# Patient Record
Sex: Female | Born: 1945 | Race: White | Hispanic: No | Smoking: Never smoker
Health system: Southern US, Community
[De-identification: ages and names within clinical notes are randomized; demographics above are authoritative.]

## PROBLEM LIST (undated history)

## (undated) DIAGNOSIS — L309 Dermatitis, unspecified: Secondary | ICD-10-CM

## (undated) DIAGNOSIS — K589 Irritable bowel syndrome without diarrhea: Secondary | ICD-10-CM

## (undated) DIAGNOSIS — F32A Depression, unspecified: Secondary | ICD-10-CM

## (undated) DIAGNOSIS — M1990 Unspecified osteoarthritis, unspecified site: Secondary | ICD-10-CM

## (undated) DIAGNOSIS — I10 Essential (primary) hypertension: Secondary | ICD-10-CM

## (undated) HISTORY — DX: Essential (primary) hypertension: I10

## (undated) HISTORY — PX: DILATION AND CURETTAGE OF UTERUS: SHX78

## (undated) HISTORY — DX: Dermatitis, unspecified: L30.9

---

## 2005-07-02 ENCOUNTER — Ambulatory Visit (HOSPITAL_COMMUNITY): Admission: RE | Admit: 2005-07-02 | Discharge: 2005-07-02 | Payer: Self-pay | Admitting: Emergency Medicine

## 2005-10-01 ENCOUNTER — Other Ambulatory Visit: Admission: RE | Admit: 2005-10-01 | Discharge: 2005-10-01 | Payer: Self-pay | Admitting: Emergency Medicine

## 2011-10-12 ENCOUNTER — Encounter (HOSPITAL_COMMUNITY): Payer: Self-pay | Admitting: Pharmacy Technician

## 2011-10-14 NOTE — Patient Instructions (Addendum)
Your procedure is scheduled on: 10/25/2011   Report to [facility] at 800        AM.  Call this number if you have problems the morning of surgery: [phone]   Do not eat food or drink liquids :After Midnight.      Take these medicines the morning of surgery with A SIP OF WATER:none   Do not wear jewelry, make-up or nail polish.  Do not wear lotions, powders, or perfumes. You may wear deodorant.  Do not shave 48 hours prior to surgery.  Do not bring valuables to the hospital.  Contacts, dentures or bridgework may not be worn into surgery.  Leave suitcase in the car. After surgery it may be brought to your room.  For patients admitted to the hospital, checkout time is 11:00 AM the day of discharge.   Patients discharged the day of surgery will not be allowed to drive home.  :     Please read over the following fact sheets that you were given: Coughing and Deep Breathing, Surgical Site Infection Prevention, Anesthesia Post-op Instructions and Care and Recovery After Surgery    Cataract A cataract is a clouding of the lens of the eye. When a lens becomes cloudy, vision is reduced based on the degree and nature of the clouding. Many cataracts reduce vision to some degree. Some cataracts make people more near-sighted as they develop. Other cataracts increase glare. Cataracts that are ignored and become worse can sometimes look white. The white color can be seen through the pupil. CAUSES   Aging. However, cataracts may occur at any age, even in newborns.   Certain drugs.   Trauma to the eye.   Certain diseases such as diabetes.   Specific eye diseases such as chronic inflammation inside the eye or a sudden attack of a rare form of glaucoma.   Inherited or acquired medical problems.  SYMPTOMS   Gradual, progressive drop in vision in the affected eye.   Severe, rapid visual loss. This most often happens when trauma is the cause.  DIAGNOSIS  To detect a cataract, an eye doctor  examines the lens. Cataracts are best diagnosed with an exam of the eyes with the pupils enlarged (dilated) by drops.  TREATMENT  For an early cataract, vision may improve by using different eyeglasses or stronger lighting. If that does not help your vision, surgery is the only effective treatment. A cataract needs to be surgically removed when vision loss interferes with your everyday activities, such as driving, reading, or watching TV. A cataract may also have to be removed if it prevents examination or treatment of another eye problem. Surgery removes the cloudy lens and usually replaces it with a substitute lens (intraocular lens, IOL).  At a time when both you and your doctor agree, the cataract will be surgically removed. If you have cataracts in both eyes, only one is usually removed at a time. This allows the operated eye to heal and be out of danger from any possible problems after surgery (such as infection or poor wound healing). In rare cases, a cataract may be doing damage to your eye. In these cases, your caregiver may advise surgical removal right away. The vast majority of people who have cataract surgery have better vision afterward. HOME CARE INSTRUCTIONS  If you are not planning surgery, you may be asked to do the following:  Use different eyeglasses.   Use stronger or brighter lighting.   Ask your eye doctor about  reducing your medicine dose or changing medicines if it is thought that a medicine caused your cataract. Changing medicines does not make the cataract go away on its own.   Become familiar with your surroundings. Poor vision can lead to injury. Avoid bumping into things on the affected side. You are at a higher risk for tripping or falling.   Exercise extreme care when driving or operating machinery.   Wear sunglasses if you are sensitive to bright light or experiencing problems with glare.  SEEK IMMEDIATE MEDICAL CARE IF:   You have a worsening or sudden vision  loss.   You notice redness, swelling, or increasing pain in the eye.   You have a fever.  Document Released: 04/05/2005 Document Revised: 03/25/2011 Document Reviewed: 11/27/2010 [facility] Patient Information 2012 [place], [place].PATIENT INSTRUCTIONS POST-ANESTHESIA  IMMEDIATELY FOLLOWING SURGERY:  Do not drive or operate machinery for the first twenty four hours after surgery.  Do not make any important decisions for twenty four hours after surgery or while taking narcotic pain medications or sedatives.  If you develop intractable nausea and vomiting or a severe headache please notify your doctor immediately.  FOLLOW-UP:  Please make an appointment with your surgeon as instructed. You do not need to follow up with anesthesia unless specifically instructed to do so.  WOUND CARE INSTRUCTIONS (if applicable):  Keep a dry clean dressing on the anesthesia/puncture wound site if there is drainage.  Once the wound has quit draining you may leave it open to air.  Generally you should leave the bandage intact for twenty four hours unless there is drainage.  If the epidural site drains for more than 36-48 hours please call the anesthesia department.  QUESTIONS?:  Please feel free to call your physician or the hospital operator if you have any questions, and they will be happy to assist you.

## 2011-10-15 ENCOUNTER — Encounter (HOSPITAL_COMMUNITY): Payer: Self-pay

## 2011-10-15 ENCOUNTER — Other Ambulatory Visit: Payer: Self-pay

## 2011-10-15 ENCOUNTER — Encounter (HOSPITAL_COMMUNITY)
Admission: RE | Admit: 2011-10-15 | Discharge: 2011-10-15 | Disposition: A | Discharge status: Home / Self Care | Payer: Medicare Other | Source: Ambulatory Visit | Attending: Ophthalmology | Admitting: Ophthalmology

## 2011-10-22 MED ORDER — LIDOCAINE HCL 3.5 % OP GEL
OPHTHALMIC | Status: AC
  Filled 2011-10-22: qty 5

## 2011-10-22 MED ORDER — CYCLOPENTOLATE-PHENYLEPHRINE 0.2-1 % OP SOLN
OPHTHALMIC | Status: AC
  Filled 2011-10-22: qty 2

## 2011-10-22 MED ORDER — LIDOCAINE HCL (PF) 1 % IJ SOLN
INTRAMUSCULAR | Status: AC
  Filled 2011-10-22: qty 2

## 2011-10-22 MED ORDER — PHENYLEPHRINE HCL 2.5 % OP SOLN
OPHTHALMIC | Status: AC
  Filled 2011-10-22: qty 2

## 2011-10-22 MED ORDER — NEOMYCIN-POLYMYXIN-DEXAMETH 3.5-10000-0.1 OP OINT
TOPICAL_OINTMENT | OPHTHALMIC | Status: AC
  Filled 2011-10-22: qty 3.5

## 2011-10-22 MED ORDER — TETRACAINE HCL 0.5 % OP SOLN
OPHTHALMIC | Status: AC
  Filled 2011-10-22: qty 2

## 2011-10-25 ENCOUNTER — Encounter (HOSPITAL_COMMUNITY)
Admission: RE | Disposition: A | Discharge status: Home / Self Care | Payer: Self-pay | Source: Ambulatory Visit | Attending: Ophthalmology

## 2011-10-25 ENCOUNTER — Encounter (HOSPITAL_COMMUNITY): Payer: Self-pay | Admitting: Anesthesiology

## 2011-10-25 ENCOUNTER — Encounter (HOSPITAL_COMMUNITY): Payer: Self-pay | Admitting: Ophthalmology

## 2011-10-25 ENCOUNTER — Ambulatory Visit (HOSPITAL_COMMUNITY): Payer: Medicare Other | Admitting: Anesthesiology

## 2011-10-25 ENCOUNTER — Ambulatory Visit (HOSPITAL_COMMUNITY)
Admission: RE | Admit: 2011-10-25 | Discharge: 2011-10-25 | Disposition: A | Discharge status: Home / Self Care | Payer: Medicare Other | Source: Ambulatory Visit | Attending: Ophthalmology | Admitting: Ophthalmology

## 2011-10-25 DIAGNOSIS — Z01812 Encounter for preprocedural laboratory examination: Secondary | ICD-10-CM | POA: Insufficient documentation

## 2011-10-25 DIAGNOSIS — Z01810 Encounter for preprocedural cardiovascular examination: Secondary | ICD-10-CM | POA: Insufficient documentation

## 2011-10-25 DIAGNOSIS — H2510 Age-related nuclear cataract, unspecified eye: Secondary | ICD-10-CM | POA: Insufficient documentation

## 2011-10-25 HISTORY — PX: CATARACT EXTRACTION W/PHACO: SHX586

## 2011-10-25 SURGERY — PHACOEMULSIFICATION, CATARACT, WITH IOL INSERTION
Anesthesia: Monitor Anesthesia Care | Site: Eye | Laterality: Right | Wound class: Clean

## 2011-10-25 MED ORDER — NEOMYCIN-POLYMYXIN-DEXAMETH 0.1 % OP OINT
TOPICAL_OINTMENT | OPHTHALMIC | Status: DC | PRN
  Administered 2011-10-25: 1 via OPHTHALMIC

## 2011-10-25 MED ORDER — LIDOCAINE HCL 3.5 % OP GEL
1.0000 "application " | Freq: Once | OPHTHALMIC | Status: AC
  Administered 2011-10-25: 1 via OPHTHALMIC

## 2011-10-25 MED ORDER — TETRACAINE HCL 0.5 % OP SOLN
1.0000 [drp] | OPHTHALMIC | Status: AC
  Administered 2011-10-25 (×3): 1 [drp] via OPHTHALMIC

## 2011-10-25 MED ORDER — MIDAZOLAM HCL 2 MG/2ML IJ SOLN
INTRAMUSCULAR | Status: AC
  Administered 2011-10-25: 2 mg via INTRAVENOUS
  Filled 2011-10-25: qty 2

## 2011-10-25 MED ORDER — PHENYLEPHRINE HCL 2.5 % OP SOLN
1.0000 [drp] | OPHTHALMIC | Status: AC
  Administered 2011-10-25 (×3): 1 [drp] via OPHTHALMIC

## 2011-10-25 MED ORDER — LACTATED RINGERS IV SOLN: INTRAVENOUS | Status: DC

## 2011-10-25 MED ORDER — LIDOCAINE 3.5 % OP GEL OPTIME - NO CHARGE
OPHTHALMIC | Status: DC | PRN
  Administered 2011-10-25: 1 [drp] via OPHTHALMIC

## 2011-10-25 MED ORDER — BSS IO SOLN
INTRAOCULAR | Status: DC | PRN
  Administered 2011-10-25: 15 mL via INTRAOCULAR

## 2011-10-25 MED ORDER — EPINEPHRINE HCL 1 MG/ML IJ SOLN
INTRAMUSCULAR | Status: AC
  Filled 2011-10-25: qty 1

## 2011-10-25 MED ORDER — GLYCOPYRROLATE 0.2 MG/ML IJ SOLN
0.2000 mg | Freq: Once | INTRAMUSCULAR | Status: AC
  Administered 2011-10-25: 0.2 mg via INTRAVENOUS

## 2011-10-25 MED ORDER — PROVISC 10 MG/ML IO SOLN
INTRAOCULAR | Status: DC | PRN
  Administered 2011-10-25: 8.5 mg via INTRAOCULAR

## 2011-10-25 MED ORDER — GLYCOPYRROLATE 0.2 MG/ML IJ SOLN
INTRAMUSCULAR | Status: AC
  Administered 2011-10-25: 0.2 mg via INTRAVENOUS
  Filled 2011-10-25: qty 1

## 2011-10-25 MED ORDER — MIDAZOLAM HCL 2 MG/2ML IJ SOLN
1.0000 mg | INTRAMUSCULAR | Status: DC | PRN
  Administered 2011-10-25: 2 mg via INTRAVENOUS

## 2011-10-25 MED ORDER — CYCLOPENTOLATE-PHENYLEPHRINE 0.2-1 % OP SOLN
1.0000 [drp] | OPHTHALMIC | Status: AC
  Administered 2011-10-25 (×3): 1 [drp] via OPHTHALMIC

## 2011-10-25 MED ORDER — BSS IO SOLN
INTRAOCULAR | Status: DC | PRN
  Administered 2011-10-25: 10:00:00

## 2011-10-25 MED ORDER — LIDOCAINE HCL (PF) 1 % IJ SOLN
INTRAMUSCULAR | Status: DC | PRN
  Administered 2011-10-25: .4 mL

## 2011-10-25 MED ORDER — LACTATED RINGERS IV SOLN
INTRAVENOUS | Status: DC | PRN
  Administered 2011-10-25: 09:00:00 via INTRAVENOUS

## 2011-10-25 MED ORDER — POVIDONE-IODINE 5 % OP SOLN
OPHTHALMIC | Status: DC | PRN
  Administered 2011-10-25: 1 via OPHTHALMIC

## 2011-10-25 SURGICAL SUPPLY — 32 items
CAPSULAR TENSION RING-AMO (OPHTHALMIC RELATED) IMPLANT
CLOTH BEACON ORANGE TIMEOUT ST (SAFETY) ×1 IMPLANT
EYE SHIELD UNIVERSAL CLEAR (GAUZE/BANDAGES/DRESSINGS) ×4 IMPLANT
GLOVE BIO SURGEON STRL SZ 6.5 (GLOVE) IMPLANT
GLOVE BIOGEL PI IND STRL 6.5 (GLOVE) IMPLANT
GLOVE BIOGEL PI IND STRL 7.0 (GLOVE) IMPLANT
GLOVE BIOGEL PI IND STRL 7.5 (GLOVE) IMPLANT
GLOVE BIOGEL PI INDICATOR 6.5 (GLOVE) ×1
GLOVE BIOGEL PI INDICATOR 7.0 (GLOVE)
GLOVE BIOGEL PI INDICATOR 7.5 (GLOVE)
GLOVE ECLIPSE 6.5 STRL STRAW (GLOVE) IMPLANT
GLOVE ECLIPSE 7.0 STRL STRAW (GLOVE) IMPLANT
GLOVE ECLIPSE 7.5 STRL STRAW (GLOVE) IMPLANT
GLOVE EXAM NITRILE LRG STRL (GLOVE) IMPLANT
GLOVE EXAM NITRILE MD LF STRL (GLOVE) ×1 IMPLANT
GLOVE SKINSENSE NS SZ6.5 (GLOVE)
GLOVE SKINSENSE NS SZ7.0 (GLOVE)
GLOVE SKINSENSE STRL SZ6.5 (GLOVE) IMPLANT
GLOVE SKINSENSE STRL SZ7.0 (GLOVE) IMPLANT
KIT VITRECTOMY (OPHTHALMIC RELATED) IMPLANT
PAD ARMBOARD 7.5X6 YLW CONV (MISCELLANEOUS) ×1 IMPLANT
PROC W NO LENS (INTRAOCULAR LENS)
PROC W SPEC LENS (INTRAOCULAR LENS)
PROCESS W NO LENS (INTRAOCULAR LENS) IMPLANT
PROCESS W SPEC LENS (INTRAOCULAR LENS) IMPLANT
RING MALYGIN (MISCELLANEOUS) IMPLANT
SIGHTPATH CAT PROC W REG LENS (Ophthalmic Related) ×2 IMPLANT
SYR TB 1ML LL NO SAFETY (SYRINGE) ×1 IMPLANT
TAPE SURG TRANSPORE 1 IN (GAUZE/BANDAGES/DRESSINGS) IMPLANT
TAPE SURGICAL TRANSPORE 1 IN (GAUZE/BANDAGES/DRESSINGS) ×1
VISCOELASTIC ADDITIONAL (OPHTHALMIC RELATED) IMPLANT
WATER STERILE IRR 250ML POUR (IV SOLUTION) ×2 IMPLANT

## 2011-10-25 NOTE — H&P (Signed)
I have reviewed the H&P, the patient was re-examined, and I have identified no interval changes in medical condition and plan of care since the history and physical of record  

## 2011-10-25 NOTE — Preoperative (Signed)
Beta Blockers   Reason not to administer Beta Blockers:Not Applicable 

## 2011-10-25 NOTE — Transfer of Care (Signed)
Immediate Anesthesia Transfer of Care Note  Patient: Loretta Stanley  Procedure(s) Performed: Procedure(s) (LRB): CATARACT EXTRACTION PHACO AND INTRAOCULAR LENS PLACEMENT (IOC) (Right)  Patient Location: PACU  Anesthesia Type: MAC  Level of Consciousness: awake, alert , oriented and patient cooperative  Airway & Oxygen Therapy: Patient Spontanous Breathing  Post-op Assessment: Report given to PACU RN and Post -op Vital signs reviewed and stable  Post vital signs: Reviewed and stable  Complications: No apparent anesthesia complications

## 2011-10-25 NOTE — Anesthesia Procedure Notes (Signed)
Procedure Name: MAC Date/Time: 10/25/2011 9:47 AM Performed by: Carolyne Littles, AMY L Pre-anesthesia Checklist: Patient identified, Patient being monitored, Emergency Drugs available, Timeout performed and Suction available Patient Re-evaluated:Patient Re-evaluated prior to inductionOxygen Delivery Method: Nasal cannula

## 2011-10-25 NOTE — Brief Op Note (Signed)
Pre-Op Dx: Cataract OD Post-Op Dx: Cataract OD Surgeon: Kalil Woessner Anesthesia: Topical with MAC Surgery: Cataract Extraction with Intraocular lens Implant OD Implant: Lenstec, Model Softec HD Blood Loss: None Specimen: None Complications: None 

## 2011-10-25 NOTE — Anesthesia Postprocedure Evaluation (Signed)
  Anesthesia Post-op Note  Patient: Loretta Stanley  Procedure(s) Performed: Procedure(s) (LRB): CATARACT EXTRACTION PHACO AND INTRAOCULAR LENS PLACEMENT (IOC) (Right)  Patient Location: PACU  Anesthesia Type: MAC  Level of Consciousness: awake, alert , oriented and patient cooperative  Airway and Oxygen Therapy: Patient Spontanous Breathing  Post-op Pain: none  Post-op Assessment: Post-op Vital signs reviewed, Patient's Cardiovascular Status Stable, Respiratory Function Stable, Patent Airway and No signs of Nausea or vomiting  Post-op Vital Signs: Reviewed and stable  Complications: No apparent anesthesia complications

## 2011-10-25 NOTE — Anesthesia Preprocedure Evaluation (Signed)
Anesthesia Evaluation  Patient identified by MRN, date of birth, ID band Patient awake    Reviewed: Allergy & Precautions, H&P , NPO status , Patient's Chart, lab work & pertinent test results  History of Anesthesia Complications Negative for: history of anesthetic complications  Airway Mallampati: I TM Distance: >3 FB Neck ROM: Full    Dental No notable dental hx. (+) Teeth Intact   Pulmonary neg pulmonary ROS,    Pulmonary exam normal       Cardiovascular negative cardio ROS  Rhythm:Regular Rate:Normal     Neuro/Psych negative neurological ROS  negative psych ROS   GI/Hepatic negative GI ROS, Neg liver ROS,   Endo/Other  negative endocrine ROS  Renal/GU negative Renal ROS     Musculoskeletal negative musculoskeletal ROS (+)   Abdominal Normal abdominal exam  (+)   Peds  Hematology negative hematology ROS (+)   Anesthesia Other Findings   Reproductive/Obstetrics                           Anesthesia Physical Anesthesia Plan  ASA: I  Anesthesia Plan: MAC   Post-op Pain Management:    Induction: Intravenous  Airway Management Planned: Nasal Cannula  Additional Equipment:   Intra-op Plan:   Post-operative Plan:   Informed Consent: I have reviewed the patients History and Physical, chart, labs and discussed the procedure including the risks, benefits and alternatives for the proposed anesthesia with the patient or authorized representative who has indicated his/her understanding and acceptance.     Plan Discussed with: CRNA  Anesthesia Plan Comments:         Anesthesia Quick Evaluation

## 2011-10-26 LAB — POCT I-STAT 4, (NA,K, GLUC, HGB,HCT)
Hemoglobin: 12.9 g/dL (ref 12.0–15.0)
Sodium: 141 mEq/L (ref 135–145)

## 2011-10-26 NOTE — Op Note (Signed)
Loretta Stanley, Loretta Stanley              ACCOUNT NO.:  [id number]  MEDICAL RECORD NO.:  [id number]  LOCATION:  [facility]                          FACILITY:  [facility]  PHYSICIAN:  Susanne Greenhouse, MD       DATE OF BIRTH:  Jul 29, 1945  DATE OF PROCEDURE:  10/25/2011 DATE OF DISCHARGE:  10/25/2011                              OPERATIVE REPORT   PREOPERATIVE DIAGNOSIS:  Nuclear cataract, right eye, diagnosis code 366.16.  POSTOPERATIVE DIAGNOSIS:  Nuclear cataract, right eye, diagnosis code 366.16.  OPERATION PERFORMED:  Phacoemulsification with posterior chamber intraocular lens implantation, right eye.  SURGEON:  Susanne Greenhouse, MD  ANESTHESIA:  Topical with IV sedation.  OPERATIVE SUMMARY:  In the preoperative area, dilating drops were placed into the right eye.  The patient was then brought into the operating room where she was placed under general anesthesia.  The eye was then prepped and draped.  Beginning with a #75 blade, a paracentesis port was made at the surgeon's 2 o'clock position.  The anterior chamber was then filled with a 1% nonpreserved lidocaine solution with epinephrine.  This was followed by Viscoat to deepen the chamber.  A small fornix-based peritomy was performed superiorly.  Next, a single iris hook was placed through the limbus superiorly.  A 2.4-mm keratome blade was then used to make a clear corneal incision over the iris hook.  A bent cystotome needle and Utrata forceps were used to create a continuous tear capsulotomy.  Hydrodissection was performed using balanced salt solution on a fine cannula.  The lens nucleus was then removed using phacoemulsification in a quadrant cracking technique.  The cortical material was then removed with irrigation and aspiration.  The capsular bag and anterior chamber were refilled with Provisc.  The wound was widened to approximately 3 mm and a posterior chamber intraocular lens was placed into the capsular bag without difficulty using an  [employer] lens injecting system.  A single 10-0 nylon suture was then used to close the incision as well as stromal hydration.  The Provisc was removed from the anterior chamber and capsular bag with irrigation and aspiration.  At this point, the wounds were tested for leak, which were negative.  The anterior chamber remained deep and stable.  The patient tolerated the procedure well.  There were no operative complications, and she awoke from general anesthesia without problem.  No surgical specimens.  Prosthetic device used is a Lenstec posterior chamber lens, model Softec HD, power of 21.75, serial number is [id number].          ______________________________ Susanne Greenhouse, MD     KEH/MEDQ  D:  10/25/2011  T:  10/26/2011  Job:  [id number]

## 2011-10-28 ENCOUNTER — Encounter (HOSPITAL_COMMUNITY): Payer: Self-pay | Admitting: Ophthalmology

## 2011-11-01 ENCOUNTER — Encounter (HOSPITAL_COMMUNITY): Payer: Self-pay | Admitting: Pharmacy Technician

## 2011-11-04 ENCOUNTER — Encounter (HOSPITAL_COMMUNITY)
Admission: RE | Admit: 2011-11-04 | Discharge: 2011-11-04 | Payer: Medicare Other | Source: Ambulatory Visit | Admitting: Ophthalmology

## 2011-11-04 MED ORDER — ACETAMINOPHEN 325 MG PO TABS: 325.0000 mg | ORAL_TABLET | ORAL | Status: DC | PRN

## 2011-11-04 MED ORDER — ONDANSETRON HCL 4 MG/2ML IJ SOLN: 4.0000 mg | Freq: Once | INTRAMUSCULAR | Status: AC | PRN

## 2011-11-04 MED ORDER — FENTANYL CITRATE 0.05 MG/ML IJ SOLN: 25.0000 ug | INTRAMUSCULAR | Status: DC | PRN

## 2011-11-05 MED ORDER — NEOMYCIN-POLYMYXIN-DEXAMETH 3.5-10000-0.1 OP OINT
TOPICAL_OINTMENT | OPHTHALMIC | Status: AC
  Filled 2011-11-05: qty 3.5

## 2011-11-05 MED ORDER — LIDOCAINE HCL 3.5 % OP GEL
OPHTHALMIC | Status: AC
  Filled 2011-11-05: qty 5

## 2011-11-05 MED ORDER — TETRACAINE HCL 0.5 % OP SOLN
OPHTHALMIC | Status: AC
  Filled 2011-11-05: qty 2

## 2011-11-05 MED ORDER — CYCLOPENTOLATE-PHENYLEPHRINE 0.2-1 % OP SOLN
OPHTHALMIC | Status: AC
  Filled 2011-11-05: qty 2

## 2011-11-05 MED ORDER — PHENYLEPHRINE HCL 2.5 % OP SOLN
OPHTHALMIC | Status: AC
  Filled 2011-11-05: qty 2

## 2011-11-05 MED ORDER — LIDOCAINE HCL (PF) 1 % IJ SOLN
INTRAMUSCULAR | Status: AC
  Filled 2011-11-05: qty 2

## 2011-11-08 ENCOUNTER — Ambulatory Visit (HOSPITAL_COMMUNITY)
Admission: RE | Admit: 2011-11-08 | Discharge: 2011-11-08 | Disposition: A | Discharge status: Home / Self Care | Payer: Medicare Other | Source: Ambulatory Visit | Attending: Ophthalmology | Admitting: Ophthalmology

## 2011-11-08 ENCOUNTER — Encounter (HOSPITAL_COMMUNITY): Payer: Self-pay | Admitting: Anesthesiology

## 2011-11-08 ENCOUNTER — Ambulatory Visit (HOSPITAL_COMMUNITY): Payer: Medicare Other | Admitting: Anesthesiology

## 2011-11-08 ENCOUNTER — Encounter (HOSPITAL_COMMUNITY): Payer: Self-pay | Admitting: *Deleted

## 2011-11-08 ENCOUNTER — Encounter (HOSPITAL_COMMUNITY)
Admission: RE | Disposition: A | Discharge status: Home / Self Care | Payer: Self-pay | Source: Ambulatory Visit | Attending: Ophthalmology

## 2011-11-08 DIAGNOSIS — H2510 Age-related nuclear cataract, unspecified eye: Secondary | ICD-10-CM | POA: Insufficient documentation

## 2011-11-08 HISTORY — PX: CATARACT EXTRACTION W/PHACO: SHX586

## 2011-11-08 SURGERY — PHACOEMULSIFICATION, CATARACT, WITH IOL INSERTION
Anesthesia: Monitor Anesthesia Care | Site: Eye | Laterality: Left | Wound class: Clean

## 2011-11-08 MED ORDER — LIDOCAINE HCL (PF) 1 % IJ SOLN
INTRAMUSCULAR | Status: DC | PRN
  Administered 2011-11-08: .4 mL

## 2011-11-08 MED ORDER — FENTANYL CITRATE 0.05 MG/ML IJ SOLN: 25.0000 ug | INTRAMUSCULAR | Status: DC | PRN

## 2011-11-08 MED ORDER — PHENYLEPHRINE HCL 2.5 % OP SOLN
1.0000 [drp] | OPHTHALMIC | Status: AC
  Administered 2011-11-08 (×3): 1 [drp] via OPHTHALMIC

## 2011-11-08 MED ORDER — TETRACAINE HCL 0.5 % OP SOLN
1.0000 [drp] | OPHTHALMIC | Status: AC
  Administered 2011-11-08 (×3): 1 [drp] via OPHTHALMIC

## 2011-11-08 MED ORDER — LIDOCAINE 3.5 % OP GEL OPTIME - NO CHARGE
OPHTHALMIC | Status: DC | PRN
  Administered 2011-11-08: 2 [drp] via OPHTHALMIC

## 2011-11-08 MED ORDER — MIDAZOLAM HCL 2 MG/2ML IJ SOLN
1.0000 mg | INTRAMUSCULAR | Status: DC | PRN
  Administered 2011-11-08: 2 mg via INTRAVENOUS

## 2011-11-08 MED ORDER — POVIDONE-IODINE 5 % OP SOLN
OPHTHALMIC | Status: DC | PRN
  Administered 2011-11-08: 1 via OPHTHALMIC

## 2011-11-08 MED ORDER — MIDAZOLAM HCL 2 MG/2ML IJ SOLN
INTRAMUSCULAR | Status: AC
  Filled 2011-11-08: qty 2

## 2011-11-08 MED ORDER — ONDANSETRON HCL 4 MG/2ML IJ SOLN: 4.0000 mg | Freq: Once | INTRAMUSCULAR | Status: DC | PRN

## 2011-11-08 MED ORDER — NEOMYCIN-POLYMYXIN-DEXAMETH 0.1 % OP OINT
TOPICAL_OINTMENT | OPHTHALMIC | Status: DC | PRN
  Administered 2011-11-08: 1 via OPHTHALMIC

## 2011-11-08 MED ORDER — PROVISC 10 MG/ML IO SOLN
INTRAOCULAR | Status: DC | PRN
  Administered 2011-11-08: 8.5 mg via INTRAOCULAR

## 2011-11-08 MED ORDER — LACTATED RINGERS IV SOLN
INTRAVENOUS | Status: DC
  Administered 2011-11-08: 1000 mL via INTRAVENOUS

## 2011-11-08 MED ORDER — BSS IO SOLN
INTRAOCULAR | Status: DC | PRN
  Administered 2011-11-08: 15 mL via INTRAOCULAR

## 2011-11-08 MED ORDER — CYCLOPENTOLATE-PHENYLEPHRINE 0.2-1 % OP SOLN
1.0000 [drp] | OPHTHALMIC | Status: AC
  Administered 2011-11-08 (×3): 1 [drp] via OPHTHALMIC

## 2011-11-08 MED ORDER — LIDOCAINE HCL 3.5 % OP GEL: 1.0000 "application " | Freq: Once | OPHTHALMIC | Status: DC

## 2011-11-08 MED ORDER — EPINEPHRINE HCL 1 MG/ML IJ SOLN
INTRAOCULAR | Status: DC | PRN
  Administered 2011-11-08: 10:00:00

## 2011-11-08 SURGICAL SUPPLY — 32 items
CAPSULAR TENSION RING-AMO (OPHTHALMIC RELATED) IMPLANT
CLOTH BEACON ORANGE TIMEOUT ST (SAFETY) IMPLANT
EYE SHIELD UNIVERSAL CLEAR (GAUZE/BANDAGES/DRESSINGS) ×1 IMPLANT
GLOVE BIO SURGEON STRL SZ 6.5 (GLOVE) IMPLANT
GLOVE BIOGEL PI IND STRL 6.5 (GLOVE) IMPLANT
GLOVE BIOGEL PI IND STRL 7.0 (GLOVE) IMPLANT
GLOVE BIOGEL PI IND STRL 7.5 (GLOVE) IMPLANT
GLOVE BIOGEL PI INDICATOR 6.5 (GLOVE) ×1
GLOVE BIOGEL PI INDICATOR 7.0 (GLOVE)
GLOVE BIOGEL PI INDICATOR 7.5 (GLOVE) ×1
GLOVE ECLIPSE 6.5 STRL STRAW (GLOVE) IMPLANT
GLOVE ECLIPSE 7.0 STRL STRAW (GLOVE) IMPLANT
GLOVE ECLIPSE 7.5 STRL STRAW (GLOVE) IMPLANT
GLOVE EXAM NITRILE LRG STRL (GLOVE) IMPLANT
GLOVE EXAM NITRILE MD LF STRL (GLOVE) ×1 IMPLANT
GLOVE SKINSENSE NS SZ6.5 (GLOVE)
GLOVE SKINSENSE NS SZ7.0 (GLOVE)
GLOVE SKINSENSE STRL SZ6.5 (GLOVE) IMPLANT
GLOVE SKINSENSE STRL SZ7.0 (GLOVE) IMPLANT
KIT VITRECTOMY (OPHTHALMIC RELATED) IMPLANT
PAD ARMBOARD 7.5X6 YLW CONV (MISCELLANEOUS) IMPLANT
PROC W NO LENS (INTRAOCULAR LENS)
PROC W SPEC LENS (INTRAOCULAR LENS)
PROCESS W NO LENS (INTRAOCULAR LENS) IMPLANT
PROCESS W SPEC LENS (INTRAOCULAR LENS) IMPLANT
RING MALYGIN (MISCELLANEOUS) IMPLANT
SIGHTPATH CAT PROC W REG LENS (Ophthalmic Related) ×2 IMPLANT
SYR TB 1ML LL NO SAFETY (SYRINGE) ×1 IMPLANT
TAPE SURG TRANSPORE 1 IN (GAUZE/BANDAGES/DRESSINGS) IMPLANT
TAPE SURGICAL TRANSPORE 1 IN (GAUZE/BANDAGES/DRESSINGS) ×1
VISCOELASTIC ADDITIONAL (OPHTHALMIC RELATED) IMPLANT
WATER STERILE IRR 250ML POUR (IV SOLUTION) ×1 IMPLANT

## 2011-11-08 NOTE — Brief Op Note (Signed)
Pre-Op Dx: Cataract OS Post-Op Dx: Cataract OS Surgeon: Maansi Wike Anesthesia: Topical with MAC Surgery: Cataract Extraction with Intraocular lens Implant OS Implant: Lenstec, Model Softec HD Specimen: None Complications: None 

## 2011-11-08 NOTE — Anesthesia Procedure Notes (Signed)
Procedure Name: MAC Date/Time: 11/08/2011 9:46 AM Performed by: Franco Nones Pre-anesthesia Checklist: Patient identified, Emergency Drugs available, Suction available, Timeout performed and Patient being monitored Patient Re-evaluated:Patient Re-evaluated prior to inductionOxygen Delivery Method: Nasal Cannula

## 2011-11-08 NOTE — Anesthesia Postprocedure Evaluation (Signed)
  Anesthesia Post-op Note  Patient: Loretta Stanley  Procedure(s) Performed: Procedure(s) (LRB): CATARACT EXTRACTION PHACO AND INTRAOCULAR LENS PLACEMENT (IOC) (Left)  Patient Location:  Short Stay  Anesthesia Type: MAC  Level of Consciousness: awake  Airway and Oxygen Therapy: Patient Spontanous Breathing  Post-op Pain: none  Post-op Assessment: Post-op Vital signs reviewed, Patient's Cardiovascular Status Stable, Respiratory Function Stable, Patent Airway, No signs of Nausea or vomiting and Pain level controlled  Post-op Vital Signs: Reviewed and stable  Complications: No apparent anesthesia complications

## 2011-11-08 NOTE — Anesthesia Preprocedure Evaluation (Signed)
Anesthesia Evaluation  Patient identified by MRN, date of birth, ID band Patient awake    Reviewed: Allergy & Precautions, H&P , NPO status , Patient's Chart, lab work & pertinent test results  History of Anesthesia Complications Negative for: history of anesthetic complications  Airway Mallampati: I TM Distance: >3 FB Neck ROM: Full    Dental No notable dental hx. (+) Teeth Intact   Pulmonary neg pulmonary ROS,    Pulmonary exam normal       Cardiovascular negative cardio ROS  Rhythm:Regular Rate:Normal     Neuro/Psych negative neurological ROS  negative psych ROS   GI/Hepatic negative GI ROS, Neg liver ROS,   Endo/Other  negative endocrine ROS  Renal/GU negative Renal ROS     Musculoskeletal negative musculoskeletal ROS (+)   Abdominal Normal abdominal exam  (+)   Peds  Hematology negative hematology ROS (+)   Anesthesia Other Findings   Reproductive/Obstetrics                           Anesthesia Physical Anesthesia Plan  ASA: I  Anesthesia Plan: MAC   Post-op Pain Management:    Induction: Intravenous  Airway Management Planned: Nasal Cannula  Additional Equipment:   Intra-op Plan:   Post-operative Plan:   Informed Consent: I have reviewed the patients History and Physical, chart, labs and discussed the procedure including the risks, benefits and alternatives for the proposed anesthesia with the patient or authorized representative who has indicated his/her understanding and acceptance.     Plan Discussed with:   Anesthesia Plan Comments:         Anesthesia Quick Evaluation

## 2011-11-08 NOTE — H&P (Signed)
I have reviewed the H&P, the patient was re-examined, and I have identified no interval changes in medical condition and plan of care since the history and physical of record  

## 2011-11-08 NOTE — Transfer of Care (Signed)
Immediate Anesthesia Transfer of Care Note  Patient: Loretta Stanley  Procedure(s) Performed: Procedure(s) (LRB): CATARACT EXTRACTION PHACO AND INTRAOCULAR LENS PLACEMENT (IOC) (Left)  Patient Location: Shortstay  Anesthesia Type: MAC  Level of Consciousness: awake  Airway & Oxygen Therapy: Patient Spontanous Breathing   Post-op Assessment: Report given to PACU RN, Post -op Vital signs reviewed and stable and Patient moving all extremities  Post vital signs: Reviewed and stable  Complications: No apparent anesthesia complications

## 2011-11-08 NOTE — Op Note (Signed)
Loretta Stanley, Loretta Stanley              ACCOUNT NO.:  [id number]  MEDICAL RECORD NO.:  [id number]  LOCATION:  [facility]                          FACILITY:  [facility]  PHYSICIAN:  Susanne Greenhouse, MD       DATE OF BIRTH:  07-23-1945  DATE OF PROCEDURE:  11/08/2011 DATE OF DISCHARGE:  11/08/2011                              OPERATIVE REPORT   PREOPERATIVE DIAGNOSIS:  Nuclear cataract, left eye, diagnosis code 366.16.  POSTOPERATIVE DIAGNOSIS:  Nuclear cataract, left eye, diagnosis code 366.16.  OPERATION PERFORMED:  Phacoemulsification with posterior chamber intraocular lens implantation, left eye.  SURGEON:  Susanne Greenhouse, MD  ANESTHESIA:  Topical with monitored anesthesia care and IV sedation.  OPERATIVE SUMMARY:  In the preoperative area, dilating drops were placed into the left eye.  The patient was then brought into the operating room where she was placed under general anesthesia.  The eye was then prepped and draped.  Beginning with a #75 blade, a paracentesis port was made at the surgeon's 2 o'clock position.  The anterior chamber was then filled with a 1% nonpreserved lidocaine solution with epinephrine.  This was followed by Viscoat to deepen the chamber.  A small fornix-based peritomy was performed superiorly.  Next, a single iris hook was placed through the limbus superiorly.  A 2.4-mm keratome blade was then used to make a clear corneal incision over the iris hook.  A bent cystotome needle and Utrata forceps were used to create a continuous tear capsulotomy.  Hydrodissection was performed using balanced salt solution on a fine cannula.  The lens nucleus was then removed using phacoemulsification in a quadrant cracking technique.  The cortical material was then removed with irrigation and aspiration.  The capsular bag and anterior chamber were refilled with Provisc.  The wound was widened to approximately 3 mm and a posterior chamber intraocular lens was placed into the capsular bag  without difficulty using an [employer] lens injecting system.  A single 10-0 nylon suture was then used to close the incision as well as stromal hydration.  The Provisc was removed from the anterior chamber and capsular bag with irrigation and aspiration.  At this point, the wounds were tested for leak, which were negative.  The anterior chamber remained deep and stable.  The patient tolerated the procedure well.  There were no operative complications, and she awoke from general anesthesia without problem.  No surgical specimens.  Prosthetic device used is a Lenstec posterior chamber lens, model Softec HD, power of 22.25, serial number is [id number].          ______________________________ Susanne Greenhouse, MD     KEH/MEDQ  D:  11/08/2011  T:  11/08/2011  Job:  [id number]

## 2011-11-10 ENCOUNTER — Encounter (HOSPITAL_COMMUNITY): Payer: Self-pay | Admitting: Ophthalmology

## 2013-10-05 ENCOUNTER — Other Ambulatory Visit (HOSPITAL_COMMUNITY): Payer: Self-pay | Admitting: Family Medicine

## 2013-10-05 DIAGNOSIS — Z1231 Encounter for screening mammogram for malignant neoplasm of breast: Secondary | ICD-10-CM

## 2013-10-05 DIAGNOSIS — Z139 Encounter for screening, unspecified: Secondary | ICD-10-CM

## 2013-10-11 ENCOUNTER — Ambulatory Visit (HOSPITAL_COMMUNITY)
Admission: RE | Admit: 2013-10-11 | Discharge: 2013-10-11 | Disposition: A | Discharge status: Home / Self Care | Payer: Medicare Other | Source: Ambulatory Visit | Attending: Family Medicine | Admitting: Family Medicine

## 2013-10-11 ENCOUNTER — Ambulatory Visit (HOSPITAL_COMMUNITY): Payer: Medicare Other

## 2013-10-11 DIAGNOSIS — Z13820 Encounter for screening for osteoporosis: Secondary | ICD-10-CM | POA: Insufficient documentation

## 2013-10-11 DIAGNOSIS — Z1231 Encounter for screening mammogram for malignant neoplasm of breast: Secondary | ICD-10-CM

## 2013-10-11 DIAGNOSIS — Z139 Encounter for screening, unspecified: Secondary | ICD-10-CM

## 2014-05-08 ENCOUNTER — Encounter (INDEPENDENT_AMBULATORY_CARE_PROVIDER_SITE_OTHER): Payer: Self-pay | Admitting: *Deleted

## 2014-06-13 ENCOUNTER — Encounter (INDEPENDENT_AMBULATORY_CARE_PROVIDER_SITE_OTHER): Payer: Self-pay | Admitting: Internal Medicine

## 2014-06-13 ENCOUNTER — Ambulatory Visit (INDEPENDENT_AMBULATORY_CARE_PROVIDER_SITE_OTHER): Payer: Medicare Other | Admitting: Internal Medicine

## 2014-06-13 DIAGNOSIS — Z1211 Encounter for screening for malignant neoplasm of colon: Secondary | ICD-10-CM

## 2014-06-13 DIAGNOSIS — I10 Essential (primary) hypertension: Secondary | ICD-10-CM | POA: Insufficient documentation

## 2014-06-13 NOTE — Progress Notes (Signed)
   Subjective:    Patient ID: Loretta Stanley, female    DOB: 27-Apr-1945, 69 y.o.   MRN: [id number]  HPIReerred by Dr. Renard Matter for IBS/Diarrhea.  Patient has never undergone a colonoscopy in the past. Appetite is good.  She occasionally has heart burn but she says it is far and between. Sometimes she "hears noises in her throat" but is not painful. She has had weight loss which was unintentional. She thinks she may lost about 12 pounds over the past year.  She occasionally has rt lower abdominal pain. She says she feels it usually when she turns on her rt side. She says occasionally has IBS symptoms/diarrhea. Sometimes she has urgency after she eats. She alternates between constipation and diarrhea,.  She usually has a BM daily. She has a flatus. No melena or BRRB.  No NSAIDS on a regular basis.  Just lost husband in December due to cardiac.     Review of Systems    Widowed. 5 children in good health. One is bipolar.  No past medical history on file.  Past Surgical History  Procedure Laterality Date  . Dilation and curettage of uterus    . Cataract extraction w/phaco  10/25/2011    Procedure: CATARACT EXTRACTION PHACO AND INTRAOCULAR LENS PLACEMENT (IOC);  Surgeon: Gemma Payor, MD;  Location: [facility];  Service: Ophthalmology;  Laterality: Right;  CDE:14.35  . Cataract extraction w/phaco  11/08/2011    Procedure: CATARACT EXTRACTION PHACO AND INTRAOCULAR LENS PLACEMENT (IOC);  Surgeon: Gemma Payor, MD;  Location: [facility];  Service: Ophthalmology;  Laterality: Left;  CDE 16.84    Allergies  Allergen Reactions  . Latex Itching    Current Outpatient Prescriptions on File Prior to Visit  Medication Sig Dispense Refill  . ibuprofen (ADVIL,MOTRIN) 200 MG tablet Take 400 mg by mouth every 6 (six) hours as needed. For pain     No current facility-administered medications on file prior to visit.        Objective:   Physical Exam  Filed Vitals:   06/13/14 1114  Height: 5\' 10"  (1.778 m)    Weight: 137 lb 8 oz (62.37 kg)   Alert and oriented. Skin warm and dry. Oral mucosa is moist.   . Sclera anicteric, conjunctivae is pink. Thyroid not enlarged. No cervical lymphadenopathy. Lungs clear. Heart regular rate and rhythm.  Abdomen is soft. Bowel sounds are positive. No hepatomegaly. No abdominal masses felt. No tenderness.  No edema to lower extremities.         Assessment & Plan:  Normal Exam. Needs screening colonoscopy.

## 2014-06-13 NOTE — Patient Instructions (Addendum)
Screening colonoscopy.The risks and benefits such as perforation, bleeding, and infection were reviewed with the patient and is agreeable. 

## 2014-07-29 ENCOUNTER — Other Ambulatory Visit (INDEPENDENT_AMBULATORY_CARE_PROVIDER_SITE_OTHER): Payer: Self-pay | Admitting: *Deleted

## 2014-07-29 ENCOUNTER — Telehealth (INDEPENDENT_AMBULATORY_CARE_PROVIDER_SITE_OTHER): Payer: Self-pay | Admitting: *Deleted

## 2014-07-29 DIAGNOSIS — Z1211 Encounter for screening for malignant neoplasm of colon: Secondary | ICD-10-CM

## 2014-07-29 NOTE — Telephone Encounter (Signed)
Patient needs trilyte 

## 2014-07-30 MED ORDER — PEG 3350-KCL-NA BICARB-NACL 420 G PO SOLR: 4000.0000 mL | Freq: Once | ORAL | Status: DC

## 2014-08-15 ENCOUNTER — Ambulatory Visit (HOSPITAL_COMMUNITY)
Admission: RE | Admit: 2014-08-15 | Discharge: 2014-08-15 | Disposition: A | Discharge status: Home / Self Care | Payer: Medicare Other | Source: Ambulatory Visit | Attending: Internal Medicine | Admitting: Internal Medicine

## 2014-08-15 ENCOUNTER — Encounter (HOSPITAL_COMMUNITY)
Admission: RE | Disposition: A | Discharge status: Home / Self Care | Payer: Self-pay | Source: Ambulatory Visit | Attending: Internal Medicine

## 2014-08-15 ENCOUNTER — Encounter (HOSPITAL_COMMUNITY): Payer: Self-pay

## 2014-08-15 DIAGNOSIS — Z961 Presence of intraocular lens: Secondary | ICD-10-CM | POA: Diagnosis not present

## 2014-08-15 DIAGNOSIS — K5730 Diverticulosis of large intestine without perforation or abscess without bleeding: Secondary | ICD-10-CM | POA: Insufficient documentation

## 2014-08-15 DIAGNOSIS — I10 Essential (primary) hypertension: Secondary | ICD-10-CM | POA: Diagnosis not present

## 2014-08-15 DIAGNOSIS — Z91040 Latex allergy status: Secondary | ICD-10-CM | POA: Diagnosis not present

## 2014-08-15 DIAGNOSIS — Z9842 Cataract extraction status, left eye: Secondary | ICD-10-CM | POA: Diagnosis not present

## 2014-08-15 DIAGNOSIS — Z1211 Encounter for screening for malignant neoplasm of colon: Secondary | ICD-10-CM | POA: Diagnosis not present

## 2014-08-15 DIAGNOSIS — Z9841 Cataract extraction status, right eye: Secondary | ICD-10-CM | POA: Insufficient documentation

## 2014-08-15 HISTORY — PX: COLONOSCOPY: SHX5424

## 2014-08-15 SURGERY — COLONOSCOPY
Anesthesia: Moderate Sedation

## 2014-08-15 MED ORDER — SODIUM CHLORIDE 0.9 % IV SOLN
INTRAVENOUS | Status: DC
  Administered 2014-08-15: 10:00:00 via INTRAVENOUS

## 2014-08-15 MED ORDER — MIDAZOLAM HCL 5 MG/5ML IJ SOLN
INTRAMUSCULAR | Status: AC
  Filled 2014-08-15: qty 10

## 2014-08-15 MED ORDER — MIDAZOLAM HCL 5 MG/5ML IJ SOLN
INTRAMUSCULAR | Status: DC | PRN
  Administered 2014-08-15 (×2): 2 mg via INTRAVENOUS
  Administered 2014-08-15: 1 mg via INTRAVENOUS
  Administered 2014-08-15: 2 mg via INTRAVENOUS

## 2014-08-15 MED ORDER — STERILE WATER FOR IRRIGATION IR SOLN
Status: DC | PRN
  Administered 2014-08-15: 10:00:00

## 2014-08-15 MED ORDER — MEPERIDINE HCL 50 MG/ML IJ SOLN
INTRAMUSCULAR | Status: AC
  Filled 2014-08-15: qty 1

## 2014-08-15 MED ORDER — MEPERIDINE HCL 50 MG/ML IJ SOLN
INTRAMUSCULAR | Status: DC | PRN
  Administered 2014-08-15 (×2): 25 mg via INTRAVENOUS

## 2014-08-15 NOTE — H&P (Signed)
CHERICE GLENNIE is an 69 y.o. female.   Chief Complaint: Patient is here for colonoscopy. HPI: Patient is 69 year old Caucasian female who is here for screening colonoscopy. This is patient's first exam. She denies abdominal pain change in her bowel habits or rectal bleeding. She has been treated for IBS in the past but lately has not had any problems. She lost about 14 or 15 pounds last year while her husband was sick. He passed away in 2014/05/03. This is patient's first exam. Family history is negative for CRC.  Past Medical History  Diagnosis Date  . Hypertension     Past Surgical History  Procedure Laterality Date  . Dilation and curettage of uterus    . Cataract extraction w/phaco  10/25/2011    Procedure: CATARACT EXTRACTION PHACO AND INTRAOCULAR LENS PLACEMENT (IOC);  Surgeon: Gemma Payor, MD;  Location: [facility];  Service: Ophthalmology;  Laterality: Right;  CDE:14.35  . Cataract extraction w/phaco  11/08/2011    Procedure: CATARACT EXTRACTION PHACO AND INTRAOCULAR LENS PLACEMENT (IOC);  Surgeon: Gemma Payor, MD;  Location: [facility];  Service: Ophthalmology;  Laterality: Left;  CDE 16.84    History reviewed. No pertinent family history. Social History:  reports that she has never smoked. She does not have any smokeless tobacco history on file. She reports that she does not drink alcohol or use illicit drugs.  Allergies:  Allergies  Allergen Reactions  . Latex Itching    Medications Prior to Admission  Medication Sig Dispense Refill  . calcium carbonate (OS-CAL) 600 MG TABS tablet Take 600 mg by mouth 2 (two) times daily with a meal.    . ibuprofen (ADVIL,MOTRIN) 200 MG tablet Take 400 mg by mouth every 6 (six) hours as needed. For pain    . lisinopril (PRINIVIL,ZESTRIL) 10 MG tablet Take 10 mg by mouth daily.    Marland Kitchen loperamide (IMODIUM) 2 MG capsule Take 2 mg by mouth as needed for diarrhea or loose stools.     . polyethylene glycol-electrolytes (NULYTELY/GOLYTELY) 420 G solution  Take 4,000 mLs by mouth once. 4000 mL 0    No results found for this or any previous visit (from the past 48 hour(s)). No results found.  ROS  Blood pressure 173/95, temperature 97.3 F (36.3 C), temperature source Oral, resp. rate 22, height 5\' 10"  (1.778 m), weight 136 lb (61.689 kg), SpO2 99 %. Physical Exam  Constitutional:  Well-developed thin Caucasian female in NAD  HENT:  Mouth/Throat: Oropharynx is clear and moist.  Eyes: Conjunctivae are normal. No scleral icterus.  Neck: No thyromegaly present.  Cardiovascular: Normal rate, regular rhythm and normal heart sounds.   No murmur heard. Respiratory: Effort normal and breath sounds normal.  GI: Soft. She exhibits no distension and no mass. There is no tenderness.  Musculoskeletal: She exhibits no edema.  Lymphadenopathy:    She has no cervical adenopathy.  Neurological: She is alert.  Skin: Skin is warm and dry.     Assessment/Plan Average risk screening colonoscopy.  Lima Chillemi U 08/15/2014, 9:47 AM

## 2014-08-15 NOTE — Discharge Instructions (Addendum)
Resume usual medications and high fiber diet. °No driving for 24 hours. °Next screening exam in 10 years. ° ° °High-Fiber Diet °Fiber is found in fruits, vegetables, and grains. A high-fiber diet encourages the addition of more whole grains, legumes, fruits, and vegetables in your diet. The recommended amount of fiber for adult males is 38 g per day. For adult females, it is 25 g per day. Pregnant and lactating women should get 28 g of fiber per day. If you have a digestive or bowel problem, ask your caregiver for advice before adding high-fiber foods to your diet. Eat a variety of high-fiber foods instead of only a select few type of foods.  °PURPOSE °· To increase stool bulk. °· To make bowel movements more regular to prevent constipation. °· To lower cholesterol. °· To prevent overeating. °WHEN IS THIS DIET USED? °· It may be used if you have constipation and hemorrhoids. °· It may be used if you have uncomplicated diverticulosis (intestine condition) and irritable bowel syndrome. °· It may be used if you need help with weight management. °· It may be used if you want to add it to your diet as a protective measure against atherosclerosis, diabetes, and cancer. °SOURCES OF FIBER °· Whole-grain breads and cereals. °· Fruits, such as apples, oranges, bananas, berries, prunes, and pears. °· Vegetables, such as green peas, carrots, sweet potatoes, beets, broccoli, cabbage, spinach, and artichokes. °· Legumes, such split peas, soy, lentils. °· Almonds. °FIBER CONTENT IN FOODS °Starches and Grains / Dietary Fiber (g) °· Cheerios, 1 cup / 3 g °· Corn Flakes cereal, 1 cup / 0.7 g °· Rice crispy treat cereal, 1¼ cup / 0.3 g °· Instant oatmeal (cooked), ½ cup / 2 g °· Frosted wheat cereal, 1 cup / 5.1 g °· Brown, long-grain rice (cooked), 1 cup / 3.5 g °· White, long-grain rice (cooked), 1 cup / 0.6 g °· Enriched macaroni (cooked), 1 cup / 2.5 g °Legumes / Dietary Fiber (g) °· Baked beans (canned, plain, or vegetarian), ½  cup / 5.2 g °· Kidney beans (canned), ½ cup / 6.8 g °· Pinto beans (cooked), ½ cup / 5.5 g °Breads and Crackers / Dietary Fiber (g) °· Plain or honey graham crackers, 2 squares / 0.7 g °· Saltine crackers, 3 squares / 0.3 g °· Plain, salted pretzels, 10 pieces / 1.8 g °· Whole-wheat bread, 1 slice / 1.9 g °· White bread, 1 slice / 0.7 g °· Raisin bread, 1 slice / 1.2 g °· Plain bagel, 3 oz / 2 g °· Flour tortilla, 1 oz / 0.9 g °· Corn tortilla, 1 small / 1.5 g °· Hamburger or hotdog bun, 1 small / 0.9 g °Fruits / Dietary Fiber (g) °· Apple with skin, 1 medium / 4.4 g °· Sweetened applesauce, ½ cup / 1.5 g °· Banana, ½ medium / 1.5 g °· Grapes, 10 grapes / 0.4 g °· Orange, 1 small / 2.3 g °· Raisin, 1.5 oz / 1.6 g °· Melon, 1 cup / 1.4 g °Vegetables / Dietary Fiber (g) °· Green beans (canned), ½ cup / 1.3 g °· Carrots (cooked), ½ cup / 2.3 g °· Broccoli (cooked), ½ cup / 2.8 g °· Peas (cooked), ½ cup / 4.4 g °· Mashed potatoes, ½ cup / 1.6 g °· Lettuce, 1 cup / 0.5 g °· Corn (canned), ½ cup / 1.6 g °· Tomato, ½ cup / 1.1 g °Document Released: 04/05/2005 Document Revised: 10/05/2011 Document Reviewed: 07/08/2011 °ExitCare® Patient   Information ©2015 [employer]. This information is not intended to replace advice given to you by your health care provider. Make sure you discuss any questions you have with your health care provider. °Colonoscopy, Care After °Refer to this sheet in the next few weeks. These instructions provide you with information on caring for yourself after your procedure. Your health care provider may also give you more specific instructions. Your treatment has been planned according to current medical practices, but problems sometimes occur. Call your health care provider if you have any problems or questions after your procedure. °WHAT TO EXPECT AFTER THE PROCEDURE  °After your procedure, it is typical to have the following: °· A small amount of blood in your stool. °· Moderate amounts of gas and  mild abdominal cramping or bloating. °HOME CARE INSTRUCTIONS °· Do not drive, operate machinery, or sign important documents for 24 hours. °· You may shower and resume your regular physical activities, but move at a slower pace for the first 24 hours. °· Take frequent rest periods for the first 24 hours. °· Walk around or put a warm pack on your abdomen to help reduce abdominal cramping and bloating. °· Drink enough fluids to keep your urine clear or pale yellow. °· You may resume your normal diet as instructed by your health care provider. Avoid heavy or fried foods that are hard to digest. °· Avoid drinking alcohol for 24 hours or as instructed by your health care provider. °· Only take over-the-counter or prescription medicines as directed by your health care provider. °· If a tissue sample (biopsy) was taken during your procedure: °· Do not take aspirin or blood thinners for 7 days, or as instructed by your health care provider. °· Do not drink alcohol for 7 days, or as instructed by your health care provider. °· Eat soft foods for the first 24 hours. °SEEK MEDICAL CARE IF: °You have persistent spotting of blood in your stool 2-3 days after the procedure. °SEEK IMMEDIATE MEDICAL CARE IF: °· You have more than a small spotting of blood in your stool. °· You pass large blood clots in your stool. °· Your abdomen is swollen (distended). °· You have nausea or vomiting. °· You have a fever. °· You have increasing abdominal pain that is not relieved with medicine. °Document Released: 11/18/2003 Document Revised: 01/24/2013 Document Reviewed: 12/11/2012 °ExitCare® Patient Information ©2015 [employer]. This information is not intended to replace advice given to you by your health care provider. Make sure you discuss any questions you have with your health care provider. ° °

## 2014-08-15 NOTE — Op Note (Signed)
COLONOSCOPY PROCEDURE REPORT  PATIENT:  Loretta Stanley  MR#:  [id number] Birthdate:  [birth year], 69 y.o., female Endoscopist:  Dr. Malissa Hippo, MD Referred By:  Dr. Alice Reichert, MD  Procedure Date: 08/15/2014  Procedure:   Colonoscopy  Indications:  Patient is 69 year old Caucasian female was undergoing average risk screening colonoscopy.  Informed Consent:  The procedure and risks were reviewed with the patient and informed consent was obtained.  Medications:  Demerol 50 mg IV Versed 7 mg IV  Description of procedure:  After a digital rectal exam was performed, that colonoscope was advanced from the anus through the rectum and colon to the area of the cecum, ileocecal valve and appendiceal orifice. The cecum was deeply intubated. These structures were well-seen and photographed for the record. From the level of the cecum and ileocecal valve, the scope was slowly and cautiously withdrawn. The mucosal surfaces were carefully surveyed utilizing scope tip to flexion to facilitate fold flattening as needed. The scope was pulled down into the rectum where a thorough exam including retroflexion was performed.  Findings:   Prep excellent. Normal mucosa of cecum and ascending colon hepatic flexure transverse colon and descending colon. Redundant sigmoid colon with moderate number of diverticula. Normal rectal mucosa and anorectal junction.   Therapeutic/Diagnostic Maneuvers Performed:  None  Complications:    Cecal Withdrawal Time:  8 minutes  Impression:  Examination performed to cecum. Moderate number of diverticula at sigmoid colon otherwise normal colonoscopy.  Recommendations:  Standard instructions given. High fiber diet. Next screening exam in 10 years.  Ata Pecha U  08/15/2014 10:26 AM  CC: Dr. Alice Reichert, MD & Dr. Bonnetta Barry ref. provider found

## 2014-08-16 ENCOUNTER — Encounter (HOSPITAL_COMMUNITY): Payer: Self-pay | Admitting: Internal Medicine

## 2015-10-15 ENCOUNTER — Other Ambulatory Visit (HOSPITAL_COMMUNITY): Payer: Self-pay | Admitting: Internal Medicine

## 2015-10-15 DIAGNOSIS — Z1231 Encounter for screening mammogram for malignant neoplasm of breast: Secondary | ICD-10-CM

## 2015-10-15 DIAGNOSIS — Z780 Asymptomatic menopausal state: Secondary | ICD-10-CM

## 2015-10-23 ENCOUNTER — Ambulatory Visit (HOSPITAL_COMMUNITY)
Admission: RE | Admit: 2015-10-23 | Discharge: 2015-10-23 | Disposition: A | Discharge status: Home / Self Care | Payer: Medicare Other | Source: Ambulatory Visit | Attending: Internal Medicine | Admitting: Internal Medicine

## 2015-10-23 DIAGNOSIS — Z1231 Encounter for screening mammogram for malignant neoplasm of breast: Secondary | ICD-10-CM | POA: Diagnosis present

## 2015-10-23 DIAGNOSIS — Z780 Asymptomatic menopausal state: Secondary | ICD-10-CM | POA: Diagnosis present

## 2015-10-23 DIAGNOSIS — M85852 Other specified disorders of bone density and structure, left thigh: Secondary | ICD-10-CM | POA: Insufficient documentation

## 2015-10-23 DIAGNOSIS — M85832 Other specified disorders of bone density and structure, left forearm: Secondary | ICD-10-CM | POA: Insufficient documentation

## 2016-05-18 DIAGNOSIS — I10 Essential (primary) hypertension: Secondary | ICD-10-CM | POA: Diagnosis not present

## 2016-05-18 DIAGNOSIS — E785 Hyperlipidemia, unspecified: Secondary | ICD-10-CM | POA: Diagnosis not present

## 2016-06-18 DIAGNOSIS — M810 Age-related osteoporosis without current pathological fracture: Secondary | ICD-10-CM | POA: Diagnosis not present

## 2016-06-18 DIAGNOSIS — I10 Essential (primary) hypertension: Secondary | ICD-10-CM | POA: Diagnosis not present

## 2016-06-18 DIAGNOSIS — E789 Disorder of lipoprotein metabolism, unspecified: Secondary | ICD-10-CM | POA: Diagnosis not present

## 2016-06-22 DIAGNOSIS — I10 Essential (primary) hypertension: Secondary | ICD-10-CM | POA: Diagnosis not present

## 2016-06-22 DIAGNOSIS — E785 Hyperlipidemia, unspecified: Secondary | ICD-10-CM | POA: Diagnosis not present

## 2016-06-22 DIAGNOSIS — R079 Chest pain, unspecified: Secondary | ICD-10-CM | POA: Diagnosis not present

## 2016-07-16 ENCOUNTER — Ambulatory Visit (INDEPENDENT_AMBULATORY_CARE_PROVIDER_SITE_OTHER): Payer: Medicare Other | Admitting: Cardiovascular Disease

## 2016-07-16 ENCOUNTER — Encounter: Payer: Self-pay | Admitting: Cardiovascular Disease

## 2016-07-16 VITALS — BP 136/96 | HR 76 | Ht 70.0 in | Wt 130.0 lb

## 2016-07-16 DIAGNOSIS — R0609 Other forms of dyspnea: Secondary | ICD-10-CM | POA: Diagnosis not present

## 2016-07-16 DIAGNOSIS — R0789 Other chest pain: Secondary | ICD-10-CM

## 2016-07-16 DIAGNOSIS — R5383 Other fatigue: Secondary | ICD-10-CM | POA: Diagnosis not present

## 2016-07-16 DIAGNOSIS — I10 Essential (primary) hypertension: Secondary | ICD-10-CM | POA: Diagnosis not present

## 2016-07-16 MED ORDER — LISINOPRIL 20 MG PO TABS: 20.0000 mg | ORAL_TABLET | Freq: Every day | ORAL | 3 refills | Status: DC

## 2016-07-16 MED ORDER — LISINOPRIL 40 MG PO TABS: 40.0000 mg | ORAL_TABLET | Freq: Every day | ORAL | 3 refills | Status: DC

## 2016-07-16 NOTE — Patient Instructions (Addendum)
Your physician recommends that you schedule a follow-up appointment in: 2 months with Dr Purvis Sheffield    INCREASE lisinopril to 40 mg daily    Your physician has requested that you have a stress echocardiogram. For further information please visit [email or web address] Please follow instruction sheet as given.        Thank you for choosing [facility] !

## 2016-07-16 NOTE — Progress Notes (Signed)
CARDIOLOGY CONSULT NOTE  Patient ID: Loretta Stanley MRN: [id number] DOB/AGE: Mar 14, 1946 71 y.o.  Admit date: (Not on file) Primary Physician: Pearson Grippe, MD Referring Physician: Selena Batten  Reason for Consultation: chest pain  HPI: Loretta Stanley is a 71 y.o. female who is being seen today for the evaluation of chest pain at the request of Pearson Grippe, MD.  I have reviewed office notes, labs, and records. She reported he has a history of hypertension.  She is a poor historian. She tells me back in January she had been feeling fatigued and had an upper respiratory tract infection. Then she tells me about a time she had shingles on her back.   She then describes a left infra-axillary pain which she describes as a "ache like a strained muscle". She said she was reaching down into her trash can and that's when the pain occurred. The pain lasted for 3-4 days. There was some associated shortness of breath. She denies exertional chest pain. She denies leg swelling, palpitations, orthopnea, and paroxysmal nocturnal dyspnea.  She tells me she feels like she has been fatigued for several months. She said it began on 2014-05-07 when her husband passed away but it has gotten worse lately.   She said her blood pressure has been more elevated in the past few months. She says she had been on lisinopril with hydrochlorothiazide but the HCTZ component was discontinued.  Labs performed on 06/18/16 demonstrated hemoglobin 11.2, platelets 251, BUN 14, creatinine 0.69, TSH 3.1.  ECG performed in the office today which I ordered and personally interpreted demonstrates normal sinus rhythm with late R wave transition.    Allergies  Allergen Reactions  . Latex Itching    Current Outpatient Prescriptions  Medication Sig Dispense Refill  . calcium carbonate (OS-CAL) 600 MG TABS tablet Take 600 mg by mouth 2 (two) times daily with a meal.    . ibuprofen (ADVIL,MOTRIN) 200 MG tablet Take 400 mg by mouth  every 6 (six) hours as needed. For pain    . lisinopril (PRINIVIL,ZESTRIL) 10 MG tablet Take 10 mg by mouth daily.    Marland Kitchen loperamide (IMODIUM) 2 MG capsule Take 2 mg by mouth as needed for diarrhea or loose stools.      No current facility-administered medications for this visit.     Past Medical History:  Diagnosis Date  . Hypertension     Past Surgical History:  Procedure Laterality Date  . CATARACT EXTRACTION W/PHACO  10/25/2011   Procedure: CATARACT EXTRACTION PHACO AND INTRAOCULAR LENS PLACEMENT (IOC);  Surgeon: Gemma Payor, MD;  Location: [facility];  Service: Ophthalmology;  Laterality: Right;  CDE:14.35  . CATARACT EXTRACTION W/PHACO  11/08/2011   Procedure: CATARACT EXTRACTION PHACO AND INTRAOCULAR LENS PLACEMENT (IOC);  Surgeon: Gemma Payor, MD;  Location: [facility];  Service: Ophthalmology;  Laterality: Left;  CDE 16.84  . COLONOSCOPY N/A 08/15/2014   Procedure: COLONOSCOPY;  Surgeon: Malissa Hippo, MD;  Location: [facility];  Service: Endoscopy;  Laterality: N/A;  930  . DILATION AND CURETTAGE OF UTERUS      Social History   Social History  . Marital status: Widowed    Spouse name: N/A  . Number of children: N/A  . Years of education: N/A   Occupational History  . Not on file.   Social History Main Topics  . Smoking status: Never Smoker  . Smokeless tobacco: Not on file  . Alcohol use No  Comment: occ  . Drug use: No  . Sexual activity: Not on file   Other Topics Concern  . Not on file   Social History Narrative  . No narrative on file     No family history of premature CAD in 1st degree relatives.  Prior to Admission medications   Medication Sig Start Date End Date Taking? Authorizing Provider  calcium carbonate (OS-CAL) 600 MG TABS tablet Take 600 mg by mouth 2 (two) times daily with a meal.    Historical Provider, MD  ibuprofen (ADVIL,MOTRIN) 200 MG tablet Take 400 mg by mouth every 6 (six) hours as needed. For pain    Historical Provider, MD    lisinopril (PRINIVIL,ZESTRIL) 10 MG tablet Take 10 mg by mouth daily.    Historical Provider, MD  loperamide (IMODIUM) 2 MG capsule Take 2 mg by mouth as needed for diarrhea or loose stools.     Historical Provider, MD     Review of systems complete and found to be negative unless listed above in HPI     Physical exam Blood pressure (!) 136/96, pulse 76, height 5\' 10"  (1.778 m), weight 130 lb (59 kg), SpO2 95 %. General: NAD Neck: No JVD, no thyromegaly or thyroid nodule.  Lungs: Clear to auscultation bilaterally with normal respiratory effort. CV: Nondisplaced PMI. Regular rate and rhythm, normal S1/S2, no S3/S4, no murmur.  No peripheral edema.  No carotid bruit.  Normal pedal pulses.  Abdomen: Soft, nontender, no hepatosplenomegaly, no distention.  Skin: Intact without lesions or rashes.  Neurologic: Alert and oriented x 3.  Psych: Normal affect. Extremities: No clubbing or cyanosis.  HEENT: Normal.   ECG: Most recent ECG reviewed.    Labs:   Lab Results  Component Value Date   HGB 12.9 10/25/2011   HCT 38.0 10/25/2011   No results for input(s): NA, K, CL, CO2, BUN, CREATININE, CALCIUM, PROT, BILITOT, ALKPHOS, ALT, AST, GLUCOSE in the last 168 hours.  Invalid input(s): LABALBU No results found for: CKTOTAL, CKMB, CKMBINDEX, TROPONINI No results found for: CHOL No results found for: HDL No results found for: LDLCALC No results found for: TRIG No results found for: CHOLHDL No results found for: LDLDIRECT       Studies: No results found.  ASSESSMENT AND PLAN:  1. Exertional dyspnea and fatigue: There seemed to be both typical and atypical symptoms for ischemic heart disease. Hypertension appears to be her primary cardiovascular risk factor. Her mother had an MI and passed away at the age of 107.  I will proceed with a stress echocardiogram to evaluate for ischemic heart disease.  2. Hypertension: Elevated. I will increase lisinopril to 40 mg daily (she had been  taking 20 mg).   Dispo: fu 2 months.   Signed: 73, M.D., F.A.C.C.  07/16/2016, 8:40 AM

## 2016-07-23 DIAGNOSIS — I10 Essential (primary) hypertension: Secondary | ICD-10-CM | POA: Diagnosis not present

## 2016-07-23 DIAGNOSIS — M545 Low back pain: Secondary | ICD-10-CM | POA: Diagnosis not present

## 2016-07-23 DIAGNOSIS — R079 Chest pain, unspecified: Secondary | ICD-10-CM | POA: Diagnosis not present

## 2016-07-26 ENCOUNTER — Inpatient Hospital Stay (HOSPITAL_COMMUNITY): Admission: RE | Admit: 2016-07-26 | Payer: Medicare Other | Source: Ambulatory Visit

## 2016-08-04 ENCOUNTER — Ambulatory Visit (HOSPITAL_COMMUNITY)
Admission: RE | Admit: 2016-08-04 | Discharge: 2016-08-04 | Disposition: A | Discharge status: Home / Self Care | Payer: Medicare Other | Source: Ambulatory Visit | Attending: Cardiovascular Disease | Admitting: Cardiovascular Disease

## 2016-08-04 DIAGNOSIS — R5383 Other fatigue: Secondary | ICD-10-CM | POA: Diagnosis not present

## 2016-08-04 DIAGNOSIS — R9439 Abnormal result of other cardiovascular function study: Secondary | ICD-10-CM | POA: Insufficient documentation

## 2016-08-04 DIAGNOSIS — R0609 Other forms of dyspnea: Secondary | ICD-10-CM | POA: Diagnosis not present

## 2016-08-04 LAB — ECHOCARDIOGRAM STRESS TEST
CHL RATE OF PERCEIVED EXERTION: 15
CSEPEDS: 24 s
CSEPHR: 100 %
Estimated workload: 7.5 METS
Exercise duration (min): 6 min
MPHR: 149 {beats}/min
Peak HR: 150 {beats}/min
Rest HR: 76 {beats}/min

## 2016-08-04 NOTE — Progress Notes (Signed)
*  PRELIMINARY RESULTS* Echocardiogram Echocardiogram Stress Test has been performed.  Stacey Drain 08/04/2016, 12:50 PM

## 2016-08-05 ENCOUNTER — Telehealth: Payer: Self-pay

## 2016-08-05 NOTE — Telephone Encounter (Signed)
Apt 08/18/16 at 1 pm with Dr Purvis Sheffield, pt aware

## 2016-08-05 NOTE — Telephone Encounter (Signed)
-----   Message from Laqueta Linden, MD sent at 08/04/2016  4:33 PM EDT ----- Abnormal with suggestion of blockage. Have her follow up with me within next 2 weeks so I can discuss next steps. Have her start ASA 81 mg daily.

## 2016-08-18 ENCOUNTER — Encounter: Payer: Self-pay | Admitting: Cardiovascular Disease

## 2016-08-18 ENCOUNTER — Ambulatory Visit (HOSPITAL_COMMUNITY)
Admission: RE | Admit: 2016-08-18 | Discharge: 2016-08-18 | Disposition: A | Discharge status: Home / Self Care | Payer: Medicare Other | Source: Ambulatory Visit | Attending: Cardiovascular Disease | Admitting: Cardiovascular Disease

## 2016-08-18 ENCOUNTER — Ambulatory Visit (INDEPENDENT_AMBULATORY_CARE_PROVIDER_SITE_OTHER): Payer: Medicare Other | Admitting: Cardiovascular Disease

## 2016-08-18 ENCOUNTER — Other Ambulatory Visit: Payer: Self-pay | Admitting: Cardiovascular Disease

## 2016-08-18 ENCOUNTER — Other Ambulatory Visit (HOSPITAL_COMMUNITY)
Admission: RE | Admit: 2016-08-18 | Discharge: 2016-08-18 | Disposition: A | Discharge status: Home / Self Care | Payer: Medicare Other | Source: Ambulatory Visit | Attending: Cardiovascular Disease | Admitting: Cardiovascular Disease

## 2016-08-18 VITALS — BP 126/87 | HR 73 | Ht 70.0 in | Wt 129.0 lb

## 2016-08-18 DIAGNOSIS — R5383 Other fatigue: Secondary | ICD-10-CM | POA: Insufficient documentation

## 2016-08-18 DIAGNOSIS — R9439 Abnormal result of other cardiovascular function study: Secondary | ICD-10-CM | POA: Insufficient documentation

## 2016-08-18 DIAGNOSIS — I10 Essential (primary) hypertension: Secondary | ICD-10-CM | POA: Insufficient documentation

## 2016-08-18 DIAGNOSIS — R0609 Other forms of dyspnea: Secondary | ICD-10-CM | POA: Insufficient documentation

## 2016-08-18 DIAGNOSIS — R918 Other nonspecific abnormal finding of lung field: Secondary | ICD-10-CM | POA: Diagnosis not present

## 2016-08-18 DIAGNOSIS — I209 Angina pectoris, unspecified: Secondary | ICD-10-CM

## 2016-08-18 LAB — CBC WITH DIFFERENTIAL/PLATELET
Basophils Absolute: 0.1 10*3/uL (ref 0.0–0.1)
Basophils Relative: 2 %
EOS ABS: 0.3 10*3/uL (ref 0.0–0.7)
Eosinophils Relative: 8 %
HEMATOCRIT: 33.2 % — AB (ref 36.0–46.0)
HEMOGLOBIN: 11.5 g/dL — AB (ref 12.0–15.0)
LYMPHS PCT: 22 %
Lymphs Abs: 0.8 10*3/uL (ref 0.7–4.0)
MCH: 30.3 pg (ref 26.0–34.0)
MCHC: 34.6 g/dL (ref 30.0–36.0)
MCV: 87.6 fL (ref 78.0–100.0)
MONOS PCT: 7 %
Monocytes Absolute: 0.3 10*3/uL (ref 0.1–1.0)
Neutro Abs: 2.4 10*3/uL (ref 1.7–7.7)
Neutrophils Relative %: 61 %
Platelets: 175 10*3/uL (ref 150–400)
RBC: 3.79 MIL/uL — ABNORMAL LOW (ref 3.87–5.11)
RDW: 12.1 % (ref 11.5–15.5)
WBC: 3.8 10*3/uL — AB (ref 4.0–10.5)

## 2016-08-18 LAB — BASIC METABOLIC PANEL
ANION GAP: 7 (ref 5–15)
BUN: 13 mg/dL (ref 6–20)
CO2: 28 mmol/L (ref 22–32)
Calcium: 9.6 mg/dL (ref 8.9–10.3)
Chloride: 97 mmol/L — ABNORMAL LOW (ref 101–111)
Creatinine, Ser: 0.74 mg/dL (ref 0.44–1.00)
Glucose, Bld: 93 mg/dL (ref 65–99)
Potassium: 4 mmol/L (ref 3.5–5.1)
Sodium: 132 mmol/L — ABNORMAL LOW (ref 135–145)

## 2016-08-18 LAB — PROTIME-INR
INR: 1.01
Prothrombin Time: 13.3 seconds (ref 11.4–15.2)

## 2016-08-18 MED ORDER — ASPIRIN EC 81 MG PO TBEC
81.0000 mg | DELAYED_RELEASE_TABLET | Freq: Every day | ORAL | 3 refills | Status: DC
Start: 2016-08-18 — End: 2020-11-03

## 2016-08-18 NOTE — Progress Notes (Signed)
    SUBJECTIVE: The patient returns for follow-up after undergoing cardiovascular testing performed for the evaluation of chest pain.  Stress echocardiogram showed a lack of hyperdynamic response with exercise and a stress induced wall motion abnormality of the anterolateral and lateral walls.  I had a lengthy discussion with her about the abnormalities on her stress test and she digressed quite a bit. She has uncertainty is about undergoing coronary angiography. I tried alleviating some of her concerns.  She has not started aspirin yet. She has had 2 episodes of chest pressure since her last visit with me.  Review of Systems: As per "subjective", otherwise negative.  Allergies  Allergen Reactions  . Latex Itching    Current Outpatient Prescriptions  Medication Sig Dispense Refill  . calcium carbonate (OS-CAL) 600 MG TABS tablet Take 600 mg by mouth 2 (two) times daily with a meal.    . ibuprofen (ADVIL,MOTRIN) 200 MG tablet Take 400 mg by mouth every 6 (six) hours as needed. For pain    . lisinopril (PRINIVIL,ZESTRIL) 40 MG tablet Take 1 tablet (40 mg total) by mouth daily. 90 tablet 3  . loperamide (IMODIUM) 2 MG capsule Take 2 mg by mouth as needed for diarrhea or loose stools.      No current facility-administered medications for this visit.     Past Medical History:  Diagnosis Date  . Hypertension     Past Surgical History:  Procedure Laterality Date  . CATARACT EXTRACTION W/PHACO  10/25/2011   Procedure: CATARACT EXTRACTION PHACO AND INTRAOCULAR LENS PLACEMENT (IOC);  Surgeon: Kerry Hunt, MD;  Location: [facility];  Service: Ophthalmology;  Laterality: Right;  CDE:14.35  . CATARACT EXTRACTION W/PHACO  11/08/2011   Procedure: CATARACT EXTRACTION PHACO AND INTRAOCULAR LENS PLACEMENT (IOC);  Surgeon: Kerry Hunt, MD;  Location: [facility];  Service: Ophthalmology;  Laterality: Left;  CDE 16.84  . COLONOSCOPY N/A 08/15/2014   Procedure: COLONOSCOPY;  Surgeon: Najeeb U Rehman, MD;   [facility];  Service: Endoscopy;  Laterality: N/A;  930  . DILATION AND CURETTAGE OF UTERUS      Social History   Social History  . Marital status: Widowed    Spouse name: N/A  . Number of children: N/A  . Years of education: N/A   Occupational History  . Not on file.   Social History Main Topics  . Smoking status: Passive Smoke Exposure - Never Smoker  . Smokeless tobacco: Never Used  . Alcohol use No     Comment: occ  . Drug use: No  . Sexual activity: Not on file   Other Topics Concern  . Not on file   Social History Narrative  . No narrative on file     Vitals:   08/18/16 1305  BP: 126/87  Pulse: 73  SpO2: 98%  Weight: 129 lb (58.5 kg)  Height: 5' 10" (1.778 m)    Wt Readings from Last 3 Encounters:  08/18/16 129 lb (58.5 kg)  07/16/16 130 lb (59 kg)  08/15/14 136 lb (61.7 kg)     PHYSICAL EXAM General: NAD HEENT: Normal. Neck: No JVD, no thyromegaly. Lungs: Clear to auscultation bilaterally with normal respiratory effort. CV: Nondisplaced PMI.  Regular rate and rhythm, normal S1/S2, no S3/S4, no murmur. No pretibial or periankle edema.  No carotid bruit.   Abdomen: Soft, nontender, no distention.  Neurologic: Alert and oriented.  Psych: Normal affect. Skin: Normal. Musculoskeletal: No gross deformities.    ECG: Most recent ECG reviewed.     Labs: Lab Results  Component Value Date/Time   K 3.1 (L) 10/25/2011 08:47 AM   HGB 12.9 10/25/2011 08:47 AM     Lipids: No results found for: LDLCALC, LDLDIRECT, CHOL, TRIG, HDL     ASSESSMENT AND PLAN: 1. Exertional dyspnea and fatigue: Symptoms continue to persist and I'm concern they may represent angina. Abnormal stress echocardiogram with inducible ischemia as detailed above. I have asked her to start taking aspirin 81 mg. I will arrange for coronary angiography.  Risks and benefits of cardiac catheterization have been discussed with the patient.  These include bleeding, infection,  kidney damage, stroke, heart attack, death.  The patient understands these risks and is willing to proceed.  2. Hypertension: Controlled with increase of lisinopril to 40 mg. No changes.  Time spent: 40 minutes, of which greater than 50% was spent reviewing symptoms, relevant blood tests and studies, and discussing management plan with the patient.   Disposition: Follow up 1 month  Prentice Docker, M.D., F.A.C.C.

## 2016-08-18 NOTE — Addendum Note (Signed)
Addended by: Kerney Elbe on: 08/18/2016 01:56 PM   Modules accepted: Orders

## 2016-08-18 NOTE — Patient Instructions (Signed)
   [facility] CARDIOVASCULAR DIVISION [facility] [place] [address] [place] [address] Dept: [phone] Loc: [phone]  Loretta Stanley  08/18/2016  You are scheduled for a Cardiac Catheterization on Wednesday, May 9 with Dr. Verne Carrow.  1. Please arrive at the [facility] (Main Entrance A) at [facility]: [address] [place], [place] [address] at 9:30 AM (two hours before your procedure to ensure your preparation). Free valet parking service is available.   Special note: Every effort is made to have your procedure done on time. Please understand that emergencies sometimes delay scheduled procedures.  2. Diet: Do not eat or drink anything after midnight prior to your procedure except sips of water to take medications.  3. Labs: You will need to have blood drawn on Wednesday, May 2 at [facility] [address], [place]  Open: 7am - 6pm, Sat 8am - 12 noon   Phone: [phone]. You do not need to be fasting.  4. Medication instructions in preparation for your procedure:     Stop taking, Advil or Motrin (Ibuprofen) on Monday, May 7.    On the morning of your procedure, take your Aspirin and any morning medicines NOT listed above.  You may use sips of water.  5. Plan for one night stay--bring personal belongings. 6. Bring a current list of your medications and current insurance cards. 7. You MUST have a responsible person to drive you home. 8. Someone MUST be with you the first 24 hours after you arrive home or your discharge will be delayed. 9. Please wear clothes that are easy to get on and off and wear slip-on shoes.  Thank you for allowing [place] to care for you!   -- [facility]

## 2016-08-25 ENCOUNTER — Encounter (HOSPITAL_COMMUNITY)
Admission: RE | Disposition: A | Discharge status: Home / Self Care | Payer: Self-pay | Source: Ambulatory Visit | Attending: Cardiovascular Disease

## 2016-08-25 ENCOUNTER — Ambulatory Visit (HOSPITAL_COMMUNITY)
Admission: RE | Admit: 2016-08-25 | Discharge: 2016-08-25 | Disposition: A | Discharge status: Home / Self Care | Payer: Medicare Other | Source: Ambulatory Visit | Attending: Cardiovascular Disease | Admitting: Cardiovascular Disease

## 2016-08-25 DIAGNOSIS — I10 Essential (primary) hypertension: Secondary | ICD-10-CM | POA: Diagnosis not present

## 2016-08-25 DIAGNOSIS — R0609 Other forms of dyspnea: Secondary | ICD-10-CM | POA: Insufficient documentation

## 2016-08-25 DIAGNOSIS — I209 Angina pectoris, unspecified: Secondary | ICD-10-CM

## 2016-08-25 DIAGNOSIS — R072 Precordial pain: Secondary | ICD-10-CM | POA: Diagnosis not present

## 2016-08-25 DIAGNOSIS — Z7722 Contact with and (suspected) exposure to environmental tobacco smoke (acute) (chronic): Secondary | ICD-10-CM | POA: Insufficient documentation

## 2016-08-25 DIAGNOSIS — R9439 Abnormal result of other cardiovascular function study: Secondary | ICD-10-CM | POA: Diagnosis not present

## 2016-08-25 DIAGNOSIS — R5383 Other fatigue: Secondary | ICD-10-CM | POA: Diagnosis not present

## 2016-08-25 HISTORY — PX: LEFT HEART CATH AND CORONARY ANGIOGRAPHY: CATH118249

## 2016-08-25 LAB — POCT ACTIVATED CLOTTING TIME: Activated Clotting Time: 142 seconds

## 2016-08-25 SURGERY — LEFT HEART CATH AND CORONARY ANGIOGRAPHY
Anesthesia: LOCAL

## 2016-08-25 MED ORDER — MIDAZOLAM HCL 2 MG/2ML IJ SOLN
INTRAMUSCULAR | Status: AC
  Filled 2016-08-25: qty 2

## 2016-08-25 MED ORDER — IOPAMIDOL (ISOVUE-370) INJECTION 76%
INTRAVENOUS | Status: AC
  Filled 2016-08-25: qty 100

## 2016-08-25 MED ORDER — HEPARIN SODIUM (PORCINE) 1000 UNIT/ML IJ SOLN
INTRAMUSCULAR | Status: AC
  Filled 2016-08-25: qty 1

## 2016-08-25 MED ORDER — SODIUM CHLORIDE 0.9 % IV SOLN: 250.0000 mL | INTRAVENOUS | Status: DC | PRN

## 2016-08-25 MED ORDER — VERAPAMIL HCL 2.5 MG/ML IV SOLN
INTRAVENOUS | Status: AC
  Filled 2016-08-25: qty 2

## 2016-08-25 MED ORDER — SODIUM CHLORIDE 0.9 % WEIGHT BASED INFUSION
3.0000 mL/kg/h | INTRAVENOUS | Status: DC
  Administered 2016-08-25: 3 mL/kg/h via INTRAVENOUS

## 2016-08-25 MED ORDER — VERAPAMIL HCL 2.5 MG/ML IV SOLN
INTRAVENOUS | Status: DC | PRN
  Administered 2016-08-25: 10 mL via INTRA_ARTERIAL

## 2016-08-25 MED ORDER — FENTANYL CITRATE (PF) 100 MCG/2ML IJ SOLN
INTRAMUSCULAR | Status: AC
  Filled 2016-08-25: qty 2

## 2016-08-25 MED ORDER — SODIUM CHLORIDE 0.9% FLUSH: 3.0000 mL | Freq: Two times a day (BID) | INTRAVENOUS | Status: DC

## 2016-08-25 MED ORDER — HEPARIN SODIUM (PORCINE) 1000 UNIT/ML IJ SOLN
INTRAMUSCULAR | Status: DC | PRN
  Administered 2016-08-25: 3000 [IU] via INTRAVENOUS

## 2016-08-25 MED ORDER — HEPARIN (PORCINE) IN NACL 2-0.9 UNIT/ML-% IJ SOLN
INTRAMUSCULAR | Status: DC | PRN
  Administered 2016-08-25: 1000 mL

## 2016-08-25 MED ORDER — SODIUM CHLORIDE 0.9 % IV SOLN
250.0000 mL | INTRAVENOUS | Status: DC | PRN
Start: 2016-08-25 — End: 2016-08-26

## 2016-08-25 MED ORDER — SODIUM CHLORIDE 0.9% FLUSH: 3.0000 mL | INTRAVENOUS | Status: DC | PRN

## 2016-08-25 MED ORDER — MIDAZOLAM HCL 2 MG/2ML IJ SOLN
INTRAMUSCULAR | Status: DC | PRN
  Administered 2016-08-25: 2 mg via INTRAVENOUS

## 2016-08-25 MED ORDER — HEPARIN (PORCINE) IN NACL 2-0.9 UNIT/ML-% IJ SOLN
INTRAMUSCULAR | Status: AC
  Filled 2016-08-25: qty 1000

## 2016-08-25 MED ORDER — LIDOCAINE HCL (PF) 1 % IJ SOLN
INTRAMUSCULAR | Status: AC
  Filled 2016-08-25: qty 30

## 2016-08-25 MED ORDER — ASPIRIN 81 MG PO CHEW: 81.0000 mg | CHEWABLE_TABLET | ORAL | Status: DC

## 2016-08-25 MED ORDER — FENTANYL CITRATE (PF) 100 MCG/2ML IJ SOLN
INTRAMUSCULAR | Status: DC | PRN
  Administered 2016-08-25: 25 ug via INTRAVENOUS

## 2016-08-25 MED ORDER — LIDOCAINE HCL (PF) 1 % IJ SOLN
INTRAMUSCULAR | Status: DC | PRN
  Administered 2016-08-25: 2 mL
  Administered 2016-08-25: 15 mL

## 2016-08-25 MED ORDER — IOPAMIDOL (ISOVUE-370) INJECTION 76%
INTRAVENOUS | Status: DC | PRN
  Administered 2016-08-25: 55 mL via INTRA_ARTERIAL

## 2016-08-25 MED ORDER — SODIUM CHLORIDE 0.9 % IV SOLN: INTRAVENOUS | Status: DC

## 2016-08-25 MED ORDER — SODIUM CHLORIDE 0.9 % WEIGHT BASED INFUSION: 1.0000 mL/kg/h | INTRAVENOUS | Status: DC

## 2016-08-25 SURGICAL SUPPLY — 15 items
CATH IMPULSE 5F ANG/FL3.5 (CATHETERS) ×1 IMPLANT
CATH INFINITI 5FR AL1 (CATHETERS) ×1 IMPLANT
CATH INFINITI 5FR JL4 (CATHETERS) ×1 IMPLANT
DEVICE RAD COMP TR BAND LRG (VASCULAR PRODUCTS) ×1 IMPLANT
GLIDESHEATH SLEND SS 6F .021 (SHEATH) ×1 IMPLANT
GUIDEWIRE .025 260CM (WIRE) ×2 IMPLANT
GUIDEWIRE 3MM J TIP .035 145 (WIRE) ×2 IMPLANT
GUIDEWIRE INQWIRE 1.5J.035X260 (WIRE) IMPLANT
INQWIRE 1.5J .035X260CM (WIRE) ×2
KIT HEART LEFT (KITS) ×2 IMPLANT
PACK CARDIAC CATHETERIZATION (CUSTOM PROCEDURE TRAY) ×2 IMPLANT
SHEATH PINNACLE 5F 10CM (SHEATH) ×1 IMPLANT
SYR MEDRAD MARK V 150ML (SYRINGE) ×2 IMPLANT
TRANSDUCER W/STOPCOCK (MISCELLANEOUS) ×2 IMPLANT
TUBING CIL FLEX 10 FLL-RA (TUBING) ×2 IMPLANT

## 2016-08-25 NOTE — H&P (View-Only) (Signed)
SUBJECTIVE: The patient returns for follow-up after undergoing cardiovascular testing performed for the evaluation of chest pain.  Stress echocardiogram showed a lack of hyperdynamic response with exercise and a stress induced wall motion abnormality of the anterolateral and lateral walls.  I had a lengthy discussion with her about the abnormalities on her stress test and she digressed quite a bit. She has uncertainty is about undergoing coronary angiography. I tried alleviating some of her concerns.  She has not started aspirin yet. She has had 2 episodes of chest pressure since her last visit with me.  Review of Systems: As per "subjective", otherwise negative.  Allergies  Allergen Reactions  . Latex Itching    Current Outpatient Prescriptions  Medication Sig Dispense Refill  . calcium carbonate (OS-CAL) 600 MG TABS tablet Take 600 mg by mouth 2 (two) times daily with a meal.    . ibuprofen (ADVIL,MOTRIN) 200 MG tablet Take 400 mg by mouth every 6 (six) hours as needed. For pain    . lisinopril (PRINIVIL,ZESTRIL) 40 MG tablet Take 1 tablet (40 mg total) by mouth daily. 90 tablet 3  . loperamide (IMODIUM) 2 MG capsule Take 2 mg by mouth as needed for diarrhea or loose stools.      No current facility-administered medications for this visit.     Past Medical History:  Diagnosis Date  . Hypertension     Past Surgical History:  Procedure Laterality Date  . CATARACT EXTRACTION W/PHACO  10/25/2011   Procedure: CATARACT EXTRACTION PHACO AND INTRAOCULAR LENS PLACEMENT (IOC);  Surgeon: Gemma Payor, MD;  Location: [facility];  Service: Ophthalmology;  Laterality: Right;  CDE:14.35  . CATARACT EXTRACTION W/PHACO  11/08/2011   Procedure: CATARACT EXTRACTION PHACO AND INTRAOCULAR LENS PLACEMENT (IOC);  Surgeon: Gemma Payor, MD;  Location: [facility];  Service: Ophthalmology;  Laterality: Left;  CDE 16.84  . COLONOSCOPY N/A 08/15/2014   Procedure: COLONOSCOPY;  Surgeon: Malissa Hippo, MD;   Location: [facility];  Service: Endoscopy;  Laterality: N/A;  930  . DILATION AND CURETTAGE OF UTERUS      Social History   Social History  . Marital status: Widowed    Spouse name: N/A  . Number of children: N/A  . Years of education: N/A   Occupational History  . Not on file.   Social History Main Topics  . Smoking status: Passive Smoke Exposure - Never Smoker  . Smokeless tobacco: Never Used  . Alcohol use No     Comment: occ  . Drug use: No  . Sexual activity: Not on file   Other Topics Concern  . Not on file   Social History Narrative  . No narrative on file     Vitals:   08/18/16 1305  BP: 126/87  Pulse: 73  SpO2: 98%  Weight: 129 lb (58.5 kg)  Height: 5\' 10"  (1.778 m)    Wt Readings from Last 3 Encounters:  08/18/16 129 lb (58.5 kg)  07/16/16 130 lb (59 kg)  08/15/14 136 lb (61.7 kg)     PHYSICAL EXAM General: NAD HEENT: Normal. Neck: No JVD, no thyromegaly. Lungs: Clear to auscultation bilaterally with normal respiratory effort. CV: Nondisplaced PMI.  Regular rate and rhythm, normal S1/S2, no S3/S4, no murmur. No pretibial or periankle edema.  No carotid bruit.   Abdomen: Soft, nontender, no distention.  Neurologic: Alert and oriented.  Psych: Normal affect. Skin: Normal. Musculoskeletal: No gross deformities.    ECG: Most recent ECG reviewed.  Labs: Lab Results  Component Value Date/Time   K 3.1 (L) 10/25/2011 08:47 AM   HGB 12.9 10/25/2011 08:47 AM     Lipids: No results found for: LDLCALC, LDLDIRECT, CHOL, TRIG, HDL     ASSESSMENT AND PLAN: 1. Exertional dyspnea and fatigue: Symptoms continue to persist and I'm concern they may represent angina. Abnormal stress echocardiogram with inducible ischemia as detailed above. I have asked her to start taking aspirin 81 mg. I will arrange for coronary angiography.  Risks and benefits of cardiac catheterization have been discussed with the patient.  These include bleeding, infection,  kidney damage, stroke, heart attack, death.  The patient understands these risks and is willing to proceed.  2. Hypertension: Controlled with increase of lisinopril to 40 mg. No changes.  Time spent: 40 minutes, of which greater than 50% was spent reviewing symptoms, relevant blood tests and studies, and discussing management plan with the patient.   Disposition: Follow up 1 month  Prentice Docker, M.D., F.A.C.C.

## 2016-08-25 NOTE — Discharge Instructions (Signed)
Radial Site Care Refer to this sheet in the next few weeks. These instructions provide you with information about caring for yourself after your procedure. Your health care provider may also give you more specific instructions. Your treatment has been planned according to current medical practices, but problems sometimes occur. Call your health care provider if you have any problems or questions after your procedure. What can I expect after the procedure? After your procedure, it is typical to have the following:  Bruising at the radial site that usually fades within 1-2 weeks.  Blood collecting in the tissue (hematoma) that may be painful to the touch. It should usually decrease in size and tenderness within 1-2 weeks. Follow these instructions at home:  Take medicines only as directed by your health care provider.  You may shower 24-48 hours after the procedure or as directed by your health care provider. Remove the bandage (dressing) and gently wash the site with plain soap and water. Pat the area dry with a clean towel. Do not rub the site, because this may cause bleeding.  Do not take baths, swim, or use a hot tub until your health care provider approves.  Check your insertion site every day for redness, swelling, or drainage.  Do not apply powder or lotion to the site.  Do not flex or bend the affected arm for 24 hours or as directed by your health care provider.  Do not push or pull heavy objects with the affected arm for 24 hours or as directed by your health care provider.  Do not lift over 10 lb (4.5 kg) for 5 days after your procedure or as directed by your health care provider.  Ask your health care provider when it is okay to:  Return to work or school.  Resume usual physical activities or sports.  Resume sexual activity.  Do not drive home if you are discharged the same day as the procedure. Have someone else drive you.  You may drive 24 hours after the procedure  unless otherwise instructed by your health care provider.  Do not operate machinery or power tools for 24 hours after the procedure.  If your procedure was done as an outpatient procedure, which means that you went home the same day as your procedure, a responsible adult should be with you for the first 24 hours after you arrive home.  Keep all follow-up visits as directed by your health care provider. This is important. Contact a health care provider if:  You have a fever.  You have chills.  You have increased bleeding from the radial site. Hold pressure on the site. Call 911 Get help right away if:  You have unusual pain at the radial site.  You have redness, warmth, or swelling at the radial site.  You have drainage (other than a small amount of blood on the dressing) from the radial site.  The radial site is bleeding, and the bleeding does not stop after 30 minutes of holding steady pressure on the site.  Your arm or hand becomes pale, cool, tingly, or numb. This information is not intended to replace advice given to you by your health care provider. Make sure you discuss any questions you have with your health care provider. Document Released: 05/08/2010 Document Revised: 09/11/2015 Document Reviewed: 10/22/2013 Elsevier Interactive Patient Education  2017 [employer] Angiogram, Care After This sheet gives you information about how to care for yourself after your procedure. Your health care provider may also give you  more specific instructions. If you have problems or questions, contact your health care provider. What can I expect after the procedure? After the procedure, it is common to have bruising and tenderness at the catheter insertion area. Follow these instructions at home: Insertion site care   Follow instructions from your health care provider about how to take care of your insertion site. Make sure you:  Wash your hands with soap and water before you change  your bandage (dressing). If soap and water are not available, use hand sanitizer.  Change your dressing as told by your health care provider.  Leave stitches (sutures), skin glue, or adhesive strips in place. These skin closures may need to stay in place for 2 weeks or longer. If adhesive strip edges start to loosen and curl up, you may trim the loose edges. Do not remove adhesive strips completely unless your health care provider tells you to do that.  Do not take baths, swim, or use a hot tub until your health care provider approves.  You may shower 24-48 hours after the procedure or as told by your health care provider.  Gently wash the site with plain soap and water.  Pat the area dry with a clean towel.  Do not rub the site. This may cause bleeding.  Do not apply powder or lotion to the site. Keep the site clean and dry.  Check your insertion site every day for signs of infection. Check for:  Redness, swelling, or pain.  Fluid or blood.  Warmth.  Pus or a bad smell. Activity   Rest as told by your health care provider, usually for 1-2 days.  Do not lift anything that is heavier than 10 lbs. (4.5 kg) or as told by your health care provider.  Do not drive for 24 hours if you were given a medicine to help you relax (sedative).  Do not drive or use heavy machinery while taking prescription pain medicine. General instructions   Return to your normal activities as told by your health care provider, usually in about a week. Ask your health care provider what activities are safe for you.  If the catheter site starts bleeding, lie flat and put pressure on the site. If the bleeding does not stop, get help right away. This is a medical emergency.  Drink enough fluid to keep your urine clear or pale yellow. This helps flush the contrast dye from your body.  Take over-the-counter and prescription medicines only as told by your health care provider.  Keep all follow-up visits as  told by your health care provider. This is important. Contact a health care provider if:  You have a fever or chills.  You have redness, swelling, or pain around your insertion site.  You have fluid or blood coming from your insertion site.  The insertion site feels warm to the touch.  You have pus or a bad smell coming from your insertion site.  You have bruising around the insertion site.  You notice blood collecting in the tissue around the catheter site (hematoma). The hematoma may be painful to the touch. Get help right away if:  You have severe pain at the catheter insertion area.  The catheter insertion area swells very fast.  The catheter insertion area is bleeding, and the bleeding does not stop when you hold steady pressure on the area.  The area near or just beyond the catheter insertion site becomes pale, cool, tingly, or numb. These symptoms may represent a  serious problem that is an emergency. Do not wait to see if the symptoms will go away. Get medical help right away. Call your local emergency services (911 in the [place] Do not drive yourself to the hospital. Summary  After the procedure, it is common to have bruising and tenderness at the catheter insertion area.  After the procedure, it is important to rest and drink plenty of fluids.  Do not take baths, swim, or use a hot tub until your health care provider says it is okay to do so. You may shower 24-48 hours after the procedure or as told by your health care provider.  If the catheter site starts bleeding, lie flat and put pressure on the site. If the bleeding does not stop, get help right away. This is a medical emergency. This information is not intended to replace advice given to you by your health care provider. Make sure you discuss any questions you have with your health care provider. Document Released: 10/22/2004 Document Revised: 03/10/2016 Document Reviewed: 03/10/2016 Elsevier Interactive Patient  Education  2017 [employer].

## 2016-08-25 NOTE — Interval H&P Note (Signed)
History and Physical Interval Note:  08/25/2016 4:27 PM  Loretta Stanley  has presented today for cardiac cath with the diagnosis of abnormal stress test, chest pain. The various methods of treatment have been discussed with the patient and family. After consideration of risks, benefits and other options for treatment, the patient has consented to  Procedure(s): Left Heart Cath and Coronary Angiography (N/A) as a surgical intervention .  The patient's history has been reviewed, patient examined, no change in status, stable for surgery.  I have reviewed the patient's chart and labs.  Questions were answered to the patient's satisfaction.    Cath Lab Visit (complete for each Cath Lab visit)  Clinical Evaluation Leading to the Procedure:   ACS: No.  Non-ACS:    Anginal Classification: CCS II  Anti-ischemic medical therapy: No Therapy  Non-Invasive Test Results: Intermediate-risk stress test findings: cardiac mortality 1-3%/year  Prior CABG: No previous CABG         Verne Carrow

## 2016-08-25 NOTE — CV Procedure (Signed)
Site area: right femoral Site Prior to Removal: level 1 Pressure Applied For: 20 mins Manual: yes Patient Status During Pull: stable Post Pull Site: level 0, clean and dryy Post Pull Instructions Given: yes Post Pull Pulses Present: 2+ DP Dressing Applied: gauze and tegaderm Bedrest begins @ 1800 Comments: Sheath pulled by Sebastian Ache, RN

## 2016-08-26 ENCOUNTER — Encounter (HOSPITAL_COMMUNITY): Payer: Self-pay | Admitting: Cardiovascular Disease

## 2016-09-20 ENCOUNTER — Ambulatory Visit (INDEPENDENT_AMBULATORY_CARE_PROVIDER_SITE_OTHER): Payer: Medicare Other | Admitting: Cardiovascular Disease

## 2016-09-20 ENCOUNTER — Encounter: Payer: Self-pay | Admitting: Cardiovascular Disease

## 2016-09-20 VITALS — BP 132/84 | HR 71 | Ht 70.0 in | Wt 131.0 lb

## 2016-09-20 DIAGNOSIS — I10 Essential (primary) hypertension: Secondary | ICD-10-CM | POA: Diagnosis not present

## 2016-09-20 DIAGNOSIS — R5383 Other fatigue: Secondary | ICD-10-CM | POA: Diagnosis not present

## 2016-09-20 DIAGNOSIS — R0609 Other forms of dyspnea: Secondary | ICD-10-CM

## 2016-09-20 NOTE — Patient Instructions (Signed)
Your physician recommends that you schedule a follow-up appointment in:  As needed    Your physician recommends that you continue on your current medications as directed. Please refer to the Current Medication list given to you today.     No testing or lab work today      Thank you for [facility] !

## 2016-09-20 NOTE — Progress Notes (Signed)
SUBJECTIVE: The patient presents for follow-up after undergoing coronary angiography which did not demonstrate any evidence of coronary artery disease.  She denies chest pain, palpitations, and shortness of breath. She does not add salt to foods. She has not been walking much outside due to the hot and humid weather. She has been monitoring her blood pressure and says it has been normal.   Review of Systems: As per "subjective", otherwise negative.  Allergies  Allergen Reactions  . Latex Itching    Current Outpatient Prescriptions  Medication Sig Dispense Refill  . aspirin EC 81 MG tablet Take 1 tablet (81 mg total) by mouth daily. 90 tablet 3  . Calcium Carbonate-Vitamin D (CALCIUM-D PO) Take 1 tablet by mouth 2 (two) times daily.    . hydrocortisone cream 1 % Apply 1 application topically daily as needed for itching.    Marland Kitchen ibuprofen (ADVIL,MOTRIN) 200 MG tablet Take 400 mg by mouth every 6 (six) hours as needed for moderate pain.     Marland Kitchen lisinopril (PRINIVIL,ZESTRIL) 40 MG tablet Take 1 tablet (40 mg total) by mouth daily. 90 tablet 3  . loperamide (IMODIUM) 2 MG capsule Take 2 mg by mouth as needed for diarrhea or loose stools.     Marland Kitchen loratadine (CLARITIN) 10 MG tablet Take 10 mg by mouth daily as needed for allergies.    . vitamin C (ASCORBIC ACID) 500 MG tablet Take 500 mg by mouth at bedtime.     No current facility-administered medications for this visit.     Past Medical History:  Diagnosis Date  . Hypertension     Past Surgical History:  Procedure Laterality Date  . CATARACT EXTRACTION W/PHACO  10/25/2011   Procedure: CATARACT EXTRACTION PHACO AND INTRAOCULAR LENS PLACEMENT (IOC);  Surgeon: Gemma Payor, MD;  Location: [facility];  Service: Ophthalmology;  Laterality: Right;  CDE:14.35  . CATARACT EXTRACTION W/PHACO  11/08/2011   Procedure: CATARACT EXTRACTION PHACO AND INTRAOCULAR LENS PLACEMENT (IOC);  Surgeon: Gemma Payor, MD;  Location: [facility];  Service: Ophthalmology;   Laterality: Left;  CDE 16.84  . COLONOSCOPY N/A 08/15/2014   Procedure: COLONOSCOPY;  Surgeon: Malissa Hippo, MD;  Location: [facility];  Service: Endoscopy;  Laterality: N/A;  930  . DILATION AND CURETTAGE OF UTERUS    . LEFT HEART CATH AND CORONARY ANGIOGRAPHY N/A 08/25/2016   Procedure: Left Heart Cath and Coronary Angiography;  Surgeon: Kathleene Hazel, MD;  Location: [facility] INVASIVE CV [facility];  Service: Cardiovascular;  Laterality: N/A;    Social History   Social History  . Marital status: Widowed    Spouse name: N/A  . Number of children: N/A  . Years of education: N/A   Occupational History  . Not on file.   Social History Main Topics  . Smoking status: Passive Smoke Exposure - Never Smoker  . Smokeless tobacco: Never Used  . Alcohol use No     Comment: occ  . Drug use: No  . Sexual activity: Not on file   Other Topics Concern  . Not on file   Social History Narrative  . No narrative on file     Vitals:   09/20/16 1302  BP: 132/84  Pulse: 71  SpO2: 98%  Weight: 131 lb (59.4 kg)  Height: 5\' 10"  (1.778 m)    Wt Readings from Last 3 Encounters:  09/20/16 131 lb (59.4 kg)  08/25/16 129 lb (58.5 kg)  08/18/16 129 lb (58.5 kg)  PHYSICAL EXAM General: NAD HEENT: Normal. Neck: No JVD, no thyromegaly. Lungs: Clear to auscultation bilaterally with normal respiratory effort. CV: Nondisplaced PMI.  Regular rate and rhythm, normal S1/S2, no S3/S4, no murmur. No pretibial or periankle edema.     Abdomen: Soft, nontender, no distention.  Neurologic: Alert and oriented.  Psych: Normal affect. Skin: Normal. Musculoskeletal: No gross deformities.    ECG: Most recent ECG reviewed.   Labs: Lab Results  Component Value Date/Time   K 4.0 08/18/2016 02:19 PM   BUN 13 08/18/2016 02:19 PM   CREATININE 0.74 08/18/2016 02:19 PM   HGB 11.5 (L) 08/18/2016 02:19 PM     Lipids: No results found for: LDLCALC, LDLDIRECT, CHOL, TRIG, HDL     ASSESSMENT AND  PLAN: 1. Exertional dyspnea and fatigue: Symptomatically stable. Noncardiac in etiology. Normal coronary angiogram as detailed above.  2. Hypertension: Controlled. No changes to therapy.    Disposition: Follow up prn  Prentice Docker, M.D., F.A.C.C.

## 2016-12-31 DIAGNOSIS — I10 Essential (primary) hypertension: Secondary | ICD-10-CM | POA: Diagnosis not present

## 2016-12-31 DIAGNOSIS — R079 Chest pain, unspecified: Secondary | ICD-10-CM | POA: Diagnosis not present

## 2017-01-04 DIAGNOSIS — E785 Hyperlipidemia, unspecified: Secondary | ICD-10-CM | POA: Diagnosis not present

## 2017-01-04 DIAGNOSIS — I10 Essential (primary) hypertension: Secondary | ICD-10-CM | POA: Diagnosis not present

## 2017-01-04 DIAGNOSIS — Z0001 Encounter for general adult medical examination with abnormal findings: Secondary | ICD-10-CM | POA: Diagnosis not present

## 2017-01-04 DIAGNOSIS — E7800 Pure hypercholesterolemia, unspecified: Secondary | ICD-10-CM | POA: Diagnosis not present

## 2017-01-04 DIAGNOSIS — B351 Tinea unguium: Secondary | ICD-10-CM | POA: Diagnosis not present

## 2017-01-04 DIAGNOSIS — M545 Low back pain: Secondary | ICD-10-CM | POA: Diagnosis not present

## 2017-01-26 DIAGNOSIS — I10 Essential (primary) hypertension: Secondary | ICD-10-CM | POA: Diagnosis not present

## 2017-01-26 DIAGNOSIS — K580 Irritable bowel syndrome with diarrhea: Secondary | ICD-10-CM | POA: Diagnosis not present

## 2017-01-26 DIAGNOSIS — Z79899 Other long term (current) drug therapy: Secondary | ICD-10-CM | POA: Diagnosis not present

## 2017-02-21 DIAGNOSIS — H26493 Other secondary cataract, bilateral: Secondary | ICD-10-CM | POA: Diagnosis not present

## 2017-03-23 DIAGNOSIS — H26493 Other secondary cataract, bilateral: Secondary | ICD-10-CM | POA: Diagnosis not present

## 2017-07-01 DIAGNOSIS — E7800 Pure hypercholesterolemia, unspecified: Secondary | ICD-10-CM | POA: Diagnosis not present

## 2017-07-05 DIAGNOSIS — I10 Essential (primary) hypertension: Secondary | ICD-10-CM | POA: Diagnosis not present

## 2017-07-05 DIAGNOSIS — Z79899 Other long term (current) drug therapy: Secondary | ICD-10-CM | POA: Diagnosis not present

## 2017-08-10 ENCOUNTER — Other Ambulatory Visit: Payer: Self-pay | Admitting: Cardiovascular Disease

## 2018-01-02 DIAGNOSIS — E785 Hyperlipidemia, unspecified: Secondary | ICD-10-CM | POA: Diagnosis not present

## 2018-01-02 DIAGNOSIS — Z79899 Other long term (current) drug therapy: Secondary | ICD-10-CM | POA: Diagnosis not present

## 2018-01-02 DIAGNOSIS — R5382 Chronic fatigue, unspecified: Secondary | ICD-10-CM | POA: Diagnosis not present

## 2018-01-02 DIAGNOSIS — I10 Essential (primary) hypertension: Secondary | ICD-10-CM | POA: Diagnosis not present

## 2018-01-06 DIAGNOSIS — E7800 Pure hypercholesterolemia, unspecified: Secondary | ICD-10-CM | POA: Diagnosis not present

## 2018-01-06 DIAGNOSIS — I10 Essential (primary) hypertension: Secondary | ICD-10-CM | POA: Diagnosis not present

## 2018-01-06 DIAGNOSIS — Z0001 Encounter for general adult medical examination with abnormal findings: Secondary | ICD-10-CM | POA: Diagnosis not present

## 2018-01-06 DIAGNOSIS — R221 Localized swelling, mass and lump, neck: Secondary | ICD-10-CM | POA: Diagnosis not present

## 2018-01-06 DIAGNOSIS — R5383 Other fatigue: Secondary | ICD-10-CM | POA: Diagnosis not present

## 2018-02-06 ENCOUNTER — Other Ambulatory Visit (HOSPITAL_COMMUNITY): Payer: Self-pay | Admitting: Family Medicine

## 2018-02-06 DIAGNOSIS — Z1231 Encounter for screening mammogram for malignant neoplasm of breast: Secondary | ICD-10-CM

## 2018-02-13 ENCOUNTER — Encounter (HOSPITAL_COMMUNITY): Payer: Self-pay

## 2018-02-13 ENCOUNTER — Ambulatory Visit (HOSPITAL_COMMUNITY): Payer: Medicare Other

## 2018-02-23 ENCOUNTER — Encounter (HOSPITAL_COMMUNITY): Payer: Self-pay

## 2018-02-23 ENCOUNTER — Ambulatory Visit (HOSPITAL_COMMUNITY)
Admission: RE | Admit: 2018-02-23 | Discharge: 2018-02-23 | Disposition: A | Discharge status: Home / Self Care | Payer: Medicare Other | Source: Ambulatory Visit | Attending: Family Medicine | Admitting: Family Medicine

## 2018-02-23 DIAGNOSIS — Z1231 Encounter for screening mammogram for malignant neoplasm of breast: Secondary | ICD-10-CM

## 2018-02-27 DIAGNOSIS — I10 Essential (primary) hypertension: Secondary | ICD-10-CM | POA: Diagnosis not present

## 2018-02-27 DIAGNOSIS — E785 Hyperlipidemia, unspecified: Secondary | ICD-10-CM | POA: Diagnosis not present

## 2018-02-27 DIAGNOSIS — R5382 Chronic fatigue, unspecified: Secondary | ICD-10-CM | POA: Diagnosis not present

## 2018-03-03 DIAGNOSIS — Z79899 Other long term (current) drug therapy: Secondary | ICD-10-CM | POA: Diagnosis not present

## 2018-03-03 DIAGNOSIS — E785 Hyperlipidemia, unspecified: Secondary | ICD-10-CM | POA: Diagnosis not present

## 2018-03-03 DIAGNOSIS — I10 Essential (primary) hypertension: Secondary | ICD-10-CM | POA: Diagnosis not present

## 2018-05-04 DIAGNOSIS — M79641 Pain in right hand: Secondary | ICD-10-CM | POA: Diagnosis not present

## 2018-08-08 ENCOUNTER — Other Ambulatory Visit: Payer: Self-pay | Admitting: Cardiovascular Disease

## 2018-08-25 DIAGNOSIS — E785 Hyperlipidemia, unspecified: Secondary | ICD-10-CM | POA: Diagnosis not present

## 2018-08-25 DIAGNOSIS — M545 Low back pain: Secondary | ICD-10-CM | POA: Diagnosis not present

## 2018-08-25 DIAGNOSIS — R5382 Chronic fatigue, unspecified: Secondary | ICD-10-CM | POA: Diagnosis not present

## 2018-08-25 DIAGNOSIS — I10 Essential (primary) hypertension: Secondary | ICD-10-CM | POA: Diagnosis not present

## 2018-08-25 DIAGNOSIS — E7800 Pure hypercholesterolemia, unspecified: Secondary | ICD-10-CM | POA: Diagnosis not present

## 2018-08-31 DIAGNOSIS — M069 Rheumatoid arthritis, unspecified: Secondary | ICD-10-CM | POA: Diagnosis not present

## 2018-08-31 DIAGNOSIS — E785 Hyperlipidemia, unspecified: Secondary | ICD-10-CM | POA: Diagnosis not present

## 2018-08-31 DIAGNOSIS — I10 Essential (primary) hypertension: Secondary | ICD-10-CM | POA: Diagnosis not present

## 2018-11-06 DIAGNOSIS — H5213 Myopia, bilateral: Secondary | ICD-10-CM | POA: Diagnosis not present

## 2018-11-24 DIAGNOSIS — H52223 Regular astigmatism, bilateral: Secondary | ICD-10-CM | POA: Diagnosis not present

## 2018-11-24 DIAGNOSIS — H524 Presbyopia: Secondary | ICD-10-CM | POA: Diagnosis not present

## 2019-01-29 DIAGNOSIS — I10 Essential (primary) hypertension: Secondary | ICD-10-CM | POA: Diagnosis not present

## 2019-01-29 DIAGNOSIS — E785 Hyperlipidemia, unspecified: Secondary | ICD-10-CM | POA: Diagnosis not present

## 2019-01-29 DIAGNOSIS — Z79899 Other long term (current) drug therapy: Secondary | ICD-10-CM | POA: Diagnosis not present

## 2019-01-29 DIAGNOSIS — M069 Rheumatoid arthritis, unspecified: Secondary | ICD-10-CM | POA: Diagnosis not present

## 2019-01-31 ENCOUNTER — Other Ambulatory Visit: Payer: Self-pay | Admitting: Cardiovascular Disease

## 2019-02-01 DIAGNOSIS — Z0001 Encounter for general adult medical examination with abnormal findings: Secondary | ICD-10-CM | POA: Diagnosis not present

## 2019-02-01 DIAGNOSIS — E785 Hyperlipidemia, unspecified: Secondary | ICD-10-CM | POA: Diagnosis not present

## 2019-02-01 DIAGNOSIS — I10 Essential (primary) hypertension: Secondary | ICD-10-CM | POA: Diagnosis not present

## 2019-05-07 DIAGNOSIS — E785 Hyperlipidemia, unspecified: Secondary | ICD-10-CM | POA: Diagnosis not present

## 2019-05-07 DIAGNOSIS — I10 Essential (primary) hypertension: Secondary | ICD-10-CM | POA: Diagnosis not present

## 2019-05-10 DIAGNOSIS — M069 Rheumatoid arthritis, unspecified: Secondary | ICD-10-CM | POA: Diagnosis not present

## 2019-05-10 DIAGNOSIS — I10 Essential (primary) hypertension: Secondary | ICD-10-CM | POA: Diagnosis not present

## 2019-05-10 DIAGNOSIS — E785 Hyperlipidemia, unspecified: Secondary | ICD-10-CM | POA: Diagnosis not present

## 2019-08-06 DIAGNOSIS — E7800 Pure hypercholesterolemia, unspecified: Secondary | ICD-10-CM | POA: Diagnosis not present

## 2019-08-06 DIAGNOSIS — I10 Essential (primary) hypertension: Secondary | ICD-10-CM | POA: Diagnosis not present

## 2019-08-09 DIAGNOSIS — E785 Hyperlipidemia, unspecified: Secondary | ICD-10-CM | POA: Diagnosis not present

## 2019-08-09 DIAGNOSIS — K580 Irritable bowel syndrome with diarrhea: Secondary | ICD-10-CM | POA: Diagnosis not present

## 2019-08-09 DIAGNOSIS — I10 Essential (primary) hypertension: Secondary | ICD-10-CM | POA: Diagnosis not present

## 2020-07-15 ENCOUNTER — Encounter (INDEPENDENT_AMBULATORY_CARE_PROVIDER_SITE_OTHER): Payer: Self-pay | Admitting: *Deleted

## 2020-11-03 ENCOUNTER — Encounter (INDEPENDENT_AMBULATORY_CARE_PROVIDER_SITE_OTHER): Payer: Self-pay | Admitting: Gastroenterology

## 2020-11-03 ENCOUNTER — Other Ambulatory Visit: Payer: Self-pay

## 2020-11-03 ENCOUNTER — Ambulatory Visit (INDEPENDENT_AMBULATORY_CARE_PROVIDER_SITE_OTHER): Payer: 59 | Admitting: Gastroenterology

## 2020-11-03 DIAGNOSIS — K580 Irritable bowel syndrome with diarrhea: Secondary | ICD-10-CM

## 2020-11-03 DIAGNOSIS — K589 Irritable bowel syndrome without diarrhea: Secondary | ICD-10-CM | POA: Insufficient documentation

## 2020-11-03 NOTE — Patient Instructions (Addendum)
Explained presumed etiology of IBS symptoms. Patient was counseled about the benefit of implementing a low FODMAP to improve symptoms and recurrent episodes. A dietary list was provided to the patient. Also, the patient was counseled about the benefit of avoiding stressing situations and potential environmental triggers leading to symptomatology. Take Beano if eating beans Continue Imodium as needed for diarrheaLow-FODMAP Eating Plan  FODMAP stands for fermentable oligosaccharides, disaccharides, monosaccharides, and polyols. These are sugars that are hard for some people to digest. A low-FODMAP eating plan may help some people who have irritable bowel syndrome (IBS) and certain other bowel (intestinal) diseases to manage their symptoms. This meal plan can be complicated to follow. Work with a diet and nutrition specialist (dietitian) to make a low-FODMAP eating plan that is right for you. A dietitian can helpmake sure that you get enough nutrition from this diet. What are tips for following this plan? Reading food labels Check labels for hidden FODMAPs such as: High-fructose syrup. Honey. Agave. Natural fruit flavors. Onion or garlic powder. Choose low-FODMAP foods that contain 3-4 grams of fiber per serving. Check food labels for serving sizes. Eat only one serving at a time to make sure FODMAP levels stay low. Shopping Shop with a list of foods that are recommended on this diet and make a meal plan. Meal planning Follow a low-FODMAP eating plan for up to 6 weeks, or as told by your health care provider or dietitian. To follow the eating plan: Eliminate high-FODMAP foods from your diet completely. Choose only low-FODMAP foods to eat. You will do this for 2-6 weeks. Gradually reintroduce high-FODMAP foods into your diet one at a time. Most people should wait a few days before introducing the next new high-FODMAP food into their meal plan. Your dietitian can recommend how quickly you may  reintroduce foods. Keep a daily record of what and how much you eat and drink. Make note of any symptoms that you have after eating. Review your daily record with a dietitian regularly to identify which foods you can eat and which foods you should avoid. General tips Drink enough fluid each day to keep your urine pale yellow. Avoid processed foods. These often have added sugar and may be high in FODMAPs. Avoid most dairy products, whole grains, and sweeteners. Work with a dietitian to make sure you get enough fiber in your diet. Avoid high FODMAP foods at meals to manage symptoms. Recommended foods Fruits Bananas, oranges, tangerines, lemons, limes, blueberries, raspberries, strawberries, grapes, cantaloupe, honeydew melon, kiwi, papaya, passion fruit, and pineapple. Limited amounts of dried cranberries, banana chips, and shreddedcoconut. Vegetables Eggplant, zucchini, cucumber, peppers, green beans, bean sprouts, lettuce, arugula, kale, Swiss chard, spinach, collard greens, bok choy, summer squash, potato, and tomato. Limited amounts of corn, carrot, and sweet potato. Greenparts of scallions. Grains Gluten-free grains, such as rice, oats, buckwheat, quinoa, corn, polenta, andmillet. Gluten-free pasta, bread, or cereal. Rice noodles. Corn tortillas. Meats and other proteins Unseasoned beef, pork, poultry, or fish. Eggs. Loretta Stanley. Tofu (firm) and tempeh. Limited amounts of nuts and seeds, such as almonds, walnuts, [place] nuts,pecans, peanuts, nut butters, pumpkin seeds, chia seeds, and sunflower seeds. Dairy Lactose-free milk, yogurt, and kefir. Lactose-free cottage cheese and ice cream. Non-dairy milks, such as almond, coconut, hemp, and rice milk. Non-dairy yogurt. Limited amounts of goat cheese, brie, mozzarella, parmesan, swiss, andother hard cheeses. Fats and oils Butter-free spreads. Vegetable oils, such as olive, canola, and sunflower oil. Seasoning and other foods Artificial sweeteners  with names that do not  end in "ol," such as aspartame, saccharine, and stevia. Maple syrup, white table sugar, raw sugar, brown sugar, and molasses. Mayonnaise, soy sauce, and tamari. Fresh basil, coriander,parsley, rosemary, and thyme. Beverages Water and mineral water. Sugar-sweetened soft drinks. Small amounts of orangejuice or cranberry juice. Black and green tea. Most dry wines. Coffee. The items listed above may not be a complete list of foods and beverages you can eat. Contact a dietitian for more information. Foods to avoid Fruits Fresh, dried, and juiced forms of apple, pear, watermelon, peach, plum, cherries, apricots, blackberries, boysenberries, figs, nectarines, and mango.Avocado. Vegetables Chicory root, artichoke, asparagus, cabbage, snow peas, Brussels sprouts, broccoli, sugar snap peas, mushrooms, celery, and cauliflower. Onions, garlic,leeks, and the white part of scallions. Grains Wheat, including kamut, durum, and semolina. Barley and bulgur. Couscous.Wheat-based cereals. Wheat noodles, bread, crackers, and pastries. Meats and other proteins Fried or fatty meat. Sausage. Cashews and pistachios. Soybeans, baked beans, black beans, chickpeas, kidney beans, fava beans, navy beans, lentils,black-eyed peas, and split peas. Dairy Milk, yogurt, ice cream, and soft cheese. Cream and sour cream. Milk-basedsauces. Custard. Buttermilk. Soy milk. Seasoning and other foods Any sugar-free gum or candy. Foods that contain artificial sweeteners such as sorbitol, mannitol, isomalt, or xylitol. Foods that contain honey, high-fructose corn syrup, or agave. Bouillon, vegetable stock, beef stock, and chicken stock. Garlic and onion powder. Condiments made with onion, such ashummus, chutney, pickles, relish, salad dressing, and salsa. Tomato paste. Beverages Chicory-based drinks. Coffee substitutes. Chamomile tea. Fennel tea. Sweet or fortified wines such as port or sherry. Diet soft drinks made with  isomalt, mannitol, maltitol, sorbitol, or xylitol. Apple, pear, and mango juice. Juiceswith high-fructose corn syrup. The items listed above may not be a complete list of foods and beverages you should avoid. Contact a dietitian for more information. Summary FODMAP stands for fermentable oligosaccharides, disaccharides, monosaccharides, and polyols. These are sugars that are hard for some people to digest. A low-FODMAP eating plan is a short-term diet that helps to ease symptoms of certain bowel diseases. The eating plan usually lasts up to 6 weeks. After that, high-FODMAP foods are reintroduced gradually and one at a time. This can help you find out which foods may be causing symptoms. A low-FODMAP eating plan can be complicated. It is best to work with a dietitian who has experience with this type of plan. This information is not intended to replace advice given to you by your health care provider. Make sure you discuss any questions you have with your healthcare provider. Document Revised: 08/23/2019 Document Reviewed: 08/23/2019 Elsevier Patient Education  2022 [employer].

## 2020-11-03 NOTE — Progress Notes (Signed)
Loretta Stanley, M.D. Gastroenterology & Hepatology [facility] [address] [place], [place] [address] Primary Care Physician: Pearson Grippe, MD [address] [place] [address]  Referring MD: PCP  Chief Complaint:  bloating and previous diarrhea  History of Present Illness: Loretta Stanley is a 75 y.o. female with PMH IBS-D and HTN, who presents for evaluation of bloating and previous diarrhea.  The patient is a poor historian. Patient reports that since she was a teenager she has had intermittent problems with bloating and gas. She reports having problems with diarrhea intermittently "for multiple years". She reports that she [facility] and was given a medication "to relax the bowel" but it made her feel worse. She came to our office 6 years ago for same reasons and underwent a colonoscopy as described below.  Patient reports that she had previously episodes of diarrhea and she reports that within 15-30 minutes of having something to eat she had an episode of diarrhea. These symptoms were intermittent but she reports that the diarrhea has resolved as she has has not had to take any Imodium in several weeks. She is having less than 4 Bms per day, usually 2-4 Bms on average. She is now concerned as she has presented persistent bloating and "gas pressure".  She states that eating things like beans/green vegetables causes significant bloating, but fried foods causes diarrhea. Was taking Culturelle for 2 weeks but did not feel any improvement. Feels sklightly better with almond milk.  Very occasionally feels water does not go down when she swallows and feels slightly "strangled". This is not frequent.  The patient denies having any nausea, vomiting, fever, chills, hematochezia, melena, hematemesis, diarrhea, jaundice, pruritus or weight loss - has been steady at 110 lb range for last year. She was 27 lb  heavier 6 years ago.  She has had issues with chewing due to significant dental issues.  States she has had "years of stress after she got married, husband had an anger issue".  Last GNF:AOZHY Last Colonoscopy:2016 Prep excellent. Normal mucosa of cecum and ascending colon hepatic flexure transverse colon and descending colon. Redundant sigmoid colon with moderate number of diverticula. Normal rectal mucosa and anorectal junction.  FHx: neg for any gastrointestinal/liver disease, no malignancies Social: neg smoking, alcohol or illicit drug use Surgical: no abdominal surgeries  Past Medical History: Past Medical History:  Diagnosis Date   Hypertension     Past Surgical History: Past Surgical History:  Procedure Laterality Date   CATARACT EXTRACTION W/PHACO  10/25/2011   Procedure: CATARACT EXTRACTION PHACO AND INTRAOCULAR LENS PLACEMENT (IOC);  Surgeon: Gemma Payor, MD;  Location: [facility];  Service: Ophthalmology;  Laterality: Right;  CDE:14.35   CATARACT EXTRACTION W/PHACO  11/08/2011   Procedure: CATARACT EXTRACTION PHACO AND INTRAOCULAR LENS PLACEMENT (IOC);  Surgeon: Gemma Payor, MD;  Location: [facility];  Service: Ophthalmology;  Laterality: Left;  CDE 16.84   COLONOSCOPY N/A 08/15/2014   Procedure: COLONOSCOPY;  Surgeon: Malissa Hippo, MD;  Location: [facility] SUITE;  Service: Endoscopy;  Laterality: N/A;  930   DILATION AND CURETTAGE OF UTERUS     LEFT HEART CATH AND CORONARY ANGIOGRAPHY N/A 08/25/2016   Procedure: Left Heart Cath and Coronary Angiography;  Surgeon: Kathleene Hazel, MD;  Location: [facility] INVASIVE CV LAB;  Service: Cardiovascular;  Laterality: N/A;    Family History: Family History  Problem Relation Age of Onset   Heart attack  Brother     Social History: Social History   Tobacco Use  Smoking Status Passive Smoke Exposure - Never Smoker  Smokeless Tobacco Never   Social History   Substance and Sexual Activity  Alcohol Use No   Alcohol/week: 0.0  standard drinks   Comment: occ   Social History   Substance and Sexual Activity  Drug Use No    Allergies: Allergies  Allergen Reactions   Latex Itching    Medications: Current Outpatient Medications  Medication Sig Dispense Refill   Calcium Carbonate-Vitamin D (CALCIUM-D PO) Take 1 tablet by mouth 2 (two) times daily.     hydrocortisone cream 1 % Apply 1 application topically daily as needed for itching.     ibuprofen (ADVIL,MOTRIN) 200 MG tablet Take 400 mg by mouth every 6 (six) hours as needed for moderate pain.      lisinopril (ZESTRIL) 40 MG tablet Take 1 tablet by mouth once daily (Patient taking differently: Take 20 mg by mouth daily.) 90 tablet 0   Omega-3 Fatty Acids (FISH OIL) 1000 MG CAPS Take by mouth daily at 6 (six) AM.     OVER THE COUNTER MEDICATION B12 one every other day.     simethicone (MYLICON) 125 MG chewable tablet Chew 125 mg by mouth every 6 (six) hours as needed for flatulence.     vitamin C (ASCORBIC ACID) 500 MG tablet Take 1,000 mg by mouth daily.     No current facility-administered medications for this visit.    Review of Systems: GENERAL: negative for malaise, night sweats HEENT: No changes in hearing or vision, no nose bleeds or other nasal problems. NECK: Negative for lumps, goiter, pain and significant neck swelling RESPIRATORY: Negative for cough, wheezing CARDIOVASCULAR: Negative for chest pain, leg swelling, palpitations, orthopnea GI: SEE HPI MUSCULOSKELETAL: Negative for joint pain or swelling, back pain, and muscle pain. SKIN: Negative for lesions, rash PSYCH: Negative for sleep disturbance, mood disorder and recent psychosocial stressors. HEMATOLOGY Negative for prolonged bleeding, bruising easily, and swollen nodes. ENDOCRINE: Negative for cold or heat intolerance, polyuria, polydipsia and goiter. NEURO: negative for tremor, gait imbalance, syncope and seizures. The remainder of the review of systems is  noncontributory.   Physical Exam: BP (!) 155/86 (BP Location: Right Arm, Patient Position: Sitting, Cuff Size: Large)   Pulse 74   Temp 99 F (37.2 C) (Oral)   Ht 5\' 10"  (1.778 m)   Wt 108 lb (49 kg)   BMI 15.50 kg/m  GENERAL: The patient is AO x3, in no acute distress. Underweight HEENT: Head is normocephalic and atraumatic. EOMI are intact. Mouth is well hydrated and without lesions. NECK: Supple. No masses LUNGS: Clear to auscultation. No presence of rhonchi/wheezing/rales. Adequate chest expansion HEART: RRR, normal s1 and s2. ABDOMEN: Soft, nontender, no guarding, no peritoneal signs, and nondistended. BS +. No masses. EXTREMITIES: Without any cyanosis, clubbing, rash, lesions or edema. NEUROLOGIC: AOx3, no focal motor deficit. SKIN: no jaundice, no rashes   Imaging/Labs: as above  I personally reviewed and interpreted the available labs, imaging and endoscopic files.  Impression and Plan: Loretta Stanley is a 75 y.o. female with PMH IBS-D and HTN, who presents for evaluation of bloating and previous diarrhea. The patient has presented intermittent bloating episodes of unclear etiology, which I consider are related to IBS-D as she has had these chronic episodes for multiple years. Had some significant weight loss but this has stabilized recently and her diarrhea seems to be better controlled for the last  few months. She is not a good historian, which limits the information available during today's encounter. I advised her to implement a low FODMAP diet and try taking Beano as needed. She can try Imodium as needed for episodes of diarrhea. If symptoms persist, consider performing an EGD to explore the symptoms further.  - Explained presumed etiology of IBS symptoms. Patient was counseled about the benefit of implementing a low FODMAP to improve symptoms and recurrent episodes. A dietary list was provided to the patient. Also, the patient was counseled about the benefit of avoiding  stressing situations and potential environmental triggers leading to symptomatology. - Take Beano if eating beans - Continue Imodium as needed for diarrhea - May consider EGD if refractory symptoms  All questions were answered.      Loretta Blazing, MD Gastroenterology and Hepatology [facility]

## 2021-04-06 ENCOUNTER — Encounter (INDEPENDENT_AMBULATORY_CARE_PROVIDER_SITE_OTHER): Payer: Self-pay | Admitting: *Deleted

## 2021-05-07 ENCOUNTER — Ambulatory Visit (INDEPENDENT_AMBULATORY_CARE_PROVIDER_SITE_OTHER): Payer: 59 | Admitting: Gastroenterology

## 2021-10-04 ENCOUNTER — Emergency Department (HOSPITAL_COMMUNITY): Payer: 59

## 2021-10-04 ENCOUNTER — Encounter (HOSPITAL_COMMUNITY): Payer: Self-pay

## 2021-10-04 ENCOUNTER — Other Ambulatory Visit: Payer: Self-pay

## 2021-10-04 ENCOUNTER — Emergency Department (HOSPITAL_COMMUNITY)
Admission: EM | Admit: 2021-10-04 | Discharge: 2021-10-04 | Disposition: A | Discharge status: Home / Self Care | Payer: 59 | Attending: Emergency Medicine | Admitting: Emergency Medicine

## 2021-10-04 DIAGNOSIS — S82032A Displaced transverse fracture of left patella, initial encounter for closed fracture: Secondary | ICD-10-CM | POA: Insufficient documentation

## 2021-10-04 DIAGNOSIS — Z79899 Other long term (current) drug therapy: Secondary | ICD-10-CM | POA: Insufficient documentation

## 2021-10-04 DIAGNOSIS — W010XXA Fall on same level from slipping, tripping and stumbling without subsequent striking against object, initial encounter: Secondary | ICD-10-CM | POA: Insufficient documentation

## 2021-10-04 DIAGNOSIS — S8992XA Unspecified injury of left lower leg, initial encounter: Secondary | ICD-10-CM | POA: Diagnosis present

## 2021-10-04 DIAGNOSIS — S60511A Abrasion of right hand, initial encounter: Secondary | ICD-10-CM | POA: Insufficient documentation

## 2021-10-04 DIAGNOSIS — S60512A Abrasion of left hand, initial encounter: Secondary | ICD-10-CM | POA: Diagnosis not present

## 2021-10-04 DIAGNOSIS — S82002A Unspecified fracture of left patella, initial encounter for closed fracture: Secondary | ICD-10-CM

## 2021-10-04 DIAGNOSIS — I10 Essential (primary) hypertension: Secondary | ICD-10-CM | POA: Diagnosis not present

## 2021-10-04 MED ORDER — MORPHINE SULFATE (PF) 2 MG/ML IV SOLN
2.0000 mg | Freq: Once | INTRAVENOUS | Status: AC
  Administered 2021-10-04: 2 mg via INTRAMUSCULAR
  Filled 2021-10-04: qty 1

## 2021-10-04 MED ORDER — TRAMADOL HCL 50 MG PO TABS: 50.0000 mg | ORAL_TABLET | Freq: Four times a day (QID) | ORAL | 0 refills | Status: DC | PRN

## 2021-10-04 MED ORDER — ONDANSETRON 4 MG PO TBDP
4.0000 mg | ORAL_TABLET | Freq: Once | ORAL | Status: AC
  Administered 2021-10-04: 4 mg via ORAL
  Filled 2021-10-04: qty 1

## 2021-10-04 NOTE — Discharge Instructions (Addendum)
Your x-rays today shows that you have a fracture of your kneecap and of your right great toe.  Keep your toe buddy taped and wear the shoe for weightbearing.  You may apply ice packs on and off to your left knee keep the knee immobilizer in place, no weightbearing on your left leg.  You will need to use a wheelchair.  Someone from the orthopedic providers office should contact you to arrange a office appointment likely for Tuesday.    Return to the emergency department for any new or worsening symptoms.

## 2021-10-04 NOTE — ED Triage Notes (Addendum)
Pt presents with a fall after tripping over a curb at church. Pt has swelling to upper lip and pain to bilateral knees, L>R. Pt denies LOC or anticoagulants.

## 2021-10-05 ENCOUNTER — Ambulatory Visit: Payer: Self-pay | Admitting: Orthopedic Surgery

## 2021-10-05 DIAGNOSIS — S82032A Displaced transverse fracture of left patella, initial encounter for closed fracture: Secondary | ICD-10-CM

## 2021-10-05 DIAGNOSIS — Z01818 Encounter for other preprocedural examination: Secondary | ICD-10-CM

## 2021-10-06 ENCOUNTER — Ambulatory Visit (INDEPENDENT_AMBULATORY_CARE_PROVIDER_SITE_OTHER): Payer: 59 | Admitting: Orthopedic Surgery

## 2021-10-06 ENCOUNTER — Encounter: Payer: Self-pay | Admitting: Orthopedic Surgery

## 2021-10-06 VITALS — Ht 70.0 in | Wt 115.0 lb

## 2021-10-06 DIAGNOSIS — S92421A Displaced fracture of distal phalanx of right great toe, initial encounter for closed fracture: Secondary | ICD-10-CM

## 2021-10-06 DIAGNOSIS — S82032A Displaced transverse fracture of left patella, initial encounter for closed fracture: Secondary | ICD-10-CM

## 2021-10-06 NOTE — ED Provider Notes (Signed)
[facility] EMERGENCY DEPARTMENT Provider Note   CSN: [id number] Arrival date & time: 10/04/21  1232     History  Chief Complaint  Patient presents with   Loretta Stanley is a 76 y.o. female.   Fall Pertinent negatives include no chest pain, no abdominal pain, no headaches and no shortness of breath.       Loretta Stanley is a 76 y.o. female with past medical history for hypertension and IBS who presents to the Emergency Department for evaluation of a mechanical fall that occurred shortly before ER arrival.  States that she was at church and tripped over a curb landing on her hands and bilateral knees.  States most of her weight fell on her left knee.  She reports immediate pain to the anterior knee and swelling.  She has been unable to bear weight to the knee since the fall.  She has some abrasions to her bilateral hands.  She denies head injury, facial injury or LOC.  She required assistance to get up.  She denies any anticoagulants.  No headache or dizziness.   Home Medications Prior to Admission medications   Medication Sig Start Date End Date Taking? Authorizing Provider  Calcium Carbonate-Vitamin D (CALCIUM-D PO) Take 1 tablet by mouth 2 (two) times daily.   Yes [provider]  hydrocortisone cream 1 % Apply 1 application topically daily as needed for itching.   Yes [provider]  ibuprofen (ADVIL,MOTRIN) 200 MG tablet Take 400 mg by mouth every 6 (six) hours as needed for moderate pain.    Yes [provider]  lisinopril (ZESTRIL) 40 MG tablet Take 1 tablet by mouth once daily Patient taking differently: Take 20 mg by mouth daily. 08/08/18  Yes Laqueta Linden, MD  OVER THE COUNTER MEDICATION B12 one tablet weekly   Yes [provider]  Simethicone (GAS-X PO) Take by mouth.   Yes [provider]  traMADol (ULTRAM) 50 MG tablet Take 1 tablet (50 mg total) by mouth every 6 (six) hours as needed. 10/04/21  Yes  Avaree Gilberti, PA-C      Allergies    Latex    Review of Systems   Review of Systems  Constitutional:  Negative for chills and fever.  Respiratory:  Negative for shortness of breath.   Cardiovascular:  Negative for chest pain.  Gastrointestinal:  Negative for abdominal pain, nausea and vomiting.  Musculoskeletal:  Positive for arthralgias (Pain swelling left knee). Negative for back pain and neck pain.  Skin:        Abrasions bilateral hands  Neurological:  Negative for dizziness, syncope, facial asymmetry, weakness, numbness and headaches.  Psychiatric/Behavioral:  Negative for confusion.     Physical Exam Updated Vital Signs BP (!) 180/104 (BP Location: Right Arm)   Pulse 76   Temp 97.9 F (36.6 C) (Oral)   Resp 20   Ht  (1.778 m)   Wt 52.2 kg   SpO2 99%   BMI 16.50 kg/m  Physical Exam Vitals and nursing note reviewed.  Constitutional:      General: She is not in acute distress.    Appearance: She is not ill-appearing or toxic-appearing.  HENT:     Head: Atraumatic.  Cardiovascular:     Rate and Rhythm: Normal rate and regular rhythm.     Pulses: Normal pulses.  Pulmonary:     Effort: Pulmonary effort is normal.     Breath sounds: Normal breath sounds.  Chest:     Chest wall: No tenderness.  Abdominal:     Palpations: Abdomen is soft.     Tenderness: There is no abdominal tenderness.  Musculoskeletal:        General: Swelling, tenderness and signs of injury present.     Cervical back: Normal range of motion. No tenderness.     Left knee: Swelling and bony tenderness present. No lacerations or crepitus. Decreased range of motion. Tenderness present. Normal alignment.     Comments: Significant edema to the anterior left knee.  Left knee is in flexed position, patient unable to extend the knee due to level of pain.  Mild bruising noted.  No tenderness of the upper or lower leg. Patient able to perform full range of motion of the right knee without  difficulty.  Small area of bruising to the anterior knee.  Patella tendon and nontender.  Patient has some tenderness and bruising to the right great toe as well.  No bony deformity or involvement of the nail.  Skin:    General: Skin is warm.     Capillary Refill: Capillary refill takes less than 2 seconds.  Neurological:     General: No focal deficit present.     Mental Status: She is alert.     Sensory: No sensory deficit.     Motor: No weakness.     ED Results / Procedures / Treatments   Labs (all labs ordered are listed, but only abnormal results are displayed) Labs Reviewed - No data to display  EKG None  Radiology DG Toe Great Right  Result Date: 10/04/2021 CLINICAL DATA:  Fall.  Bruising. EXAM: RIGHT GREAT TOE COMPARISON:  None FINDINGS: Soft tissue laceration is present over the nail bed. Nondisplaced transverse fracture is present in the distal phalanx. No foreign body is present. Extensive soft tissue swelling is present. IMPRESSION: 1. Nondisplaced transverse fracture in the distal phalanx. 2. Soft tissue laceration over the nail bed. Electronically Signed   By: Marin Roberts M.D.   On: 10/04/2021 16:14    Procedures Procedures    Medications Ordered in ED Medications  morphine (PF) 2 MG/ML injection 2 mg (2 mg Intramuscular Given 10/04/21 1743)  ondansetron (ZOFRAN-ODT) disintegrating tablet 4 mg (4 mg Oral Given 10/04/21 1741)    ED Course/ Medical Decision Making/ A&P                           Medical Decision Making Patient here for evaluation of injury sustained in a mechanical fall.  Larey Seat forward landing on both hands and knees.  No head injury or LOC.  She denies any neck or back pain.  On exam, she has significant swelling and pain of the anterior left knee.  Knee is held in a flexed position.  Patient unable to extend due to level of pain.  I suspect bony injury.  No significant pain with range of motion of the bilateral wrist or fingers.  She does  have some tenderness and bruising of the right great toe as well.  Amount and/or Complexity of Data Reviewed Radiology: ordered.    Details: X-ray of the left knee shows displaced fracture of the left patella, x-ray of the right great toe shows a nondisplaced transverse fracture in the distal phalanx.  X-ray report soft tissue laceration over the nailbed, but on exam there is no laceration.  There is no nail involvement. Discussion of management or test interpretation with external  provider(s): Discussed findings with orthopedics, Dr. Dallas Schimke, who recommends knee immobilizer, nonweightbearing and he will follow-up patient in clinic early next week.  Patient also will have right great toe buddy taped and postop shoe applied along with knee immobilizer.  Discussed recommendations with patient's family who is also at bedside.  They have a wheelchair at home that patient may use.  They Understand that she needs to be nonweightbearing on the left extremity.  Risk Prescription drug management.           Final Clinical Impression(s) / ED Diagnoses Final diagnoses:  Closed displaced fracture of left patella, unspecified fracture morphology, initial encounter  Closed displaced transverse fracture of left patella, initial encounter    Rx / DC Orders ED Discharge Orders          Ordered    traMADol (ULTRAM) 50 MG tablet  Every 6 hours PRN        10/04/21 1706              Pauline Aus, PA-C 10/06/21 1420    Vanetta Mulders, MD 10/17/21 2324

## 2021-10-06 NOTE — Progress Notes (Signed)
New Patient Visit  Assessment: Loretta Stanley is a 76 y.o. female with the following: 1. Closed displaced transverse fracture of left patella 2.  Right great toe, distal phalanx fracture  Plan: Loretta Stanley sustained a fracture of the right great toe, which we can treat without surgery.  However, I have recommended operative fixation of the transverse left patella fracture.  Surgery was discussed in great detail.  She has limited swelling on physical exam today.  I think it is safe to proceed with surgery within the next couple of days.  Surgery will take place on October 08, 2021.  She will be evaluated in the preop clinic tomorrow.  Risks and benefits of surgery, including, but not limited to infection, bleeding, persistent pain, damage to surrounding structures, need for further surgery, malunion, nonunion and more severe complications associated with anesthesia were discussed.  All questions have been answered and they have elected to proceed with surgery.    She is to remain nonweightbearing on the left lower extremity, wearing a knee immobilizer.  She should continue to wear the fracture shoe on the right foot, and it is okay to bear weight through her right heel.  Follow-up: Return for After surgery.  Subjective:  Chief Complaint  Patient presents with   Fracture    Lt knee and rt toe fx DOI 10/04/21    History of Present Illness: Loretta Stanley is a 76 y.o. female who presents for evaluation of left knee pain.  After church on Sunday, she fell, and landed on her left leg.  She was evaluated in the emergency department, noted to have a fracture of the right great toe, as well as a left patella fracture.  She has been in an immobilizer.  She has not been bearing weight.  She continues to have pain in the left knee.  She is wearing a fracture shoe on the right foot.  She has been wearing a knee immobilizer, and has removed this for hygiene.   Review of Systems: No fevers or  chills No numbness or tingling No chest pain No shortness of breath No bowel or bladder dysfunction No GI distress No headaches   Medical History:  Past Medical History:  Diagnosis Date   Hypertension     Past Surgical History:  Procedure Laterality Date   CATARACT EXTRACTION W/PHACO  10/25/2011   Procedure: CATARACT EXTRACTION PHACO AND INTRAOCULAR LENS PLACEMENT (IOC);  Surgeon: Gemma Payor, MD;  Location: [facility];  Service: Ophthalmology;  Laterality: Right;  CDE:14.35   CATARACT EXTRACTION W/PHACO  11/08/2011   Procedure: CATARACT EXTRACTION PHACO AND INTRAOCULAR LENS PLACEMENT (IOC);  Surgeon: Gemma Payor, MD;  Location: [facility];  Service: Ophthalmology;  Laterality: Left;  CDE 16.84   COLONOSCOPY N/A 08/15/2014   Procedure: COLONOSCOPY;  Surgeon: Malissa Hippo, MD;  Location: [facility] SUITE;  Service: Endoscopy;  Laterality: N/A;  930   DILATION AND CURETTAGE OF UTERUS     LEFT HEART CATH AND CORONARY ANGIOGRAPHY N/A 08/25/2016   Procedure: Left Heart Cath and Coronary Angiography;  Surgeon: Kathleene Hazel, MD;  Location: [facility] INVASIVE CV LAB;  Service: Cardiovascular;  Laterality: N/A;    Family History  Problem Relation Age of Onset   Heart attack Brother    Social History   Tobacco Use   Smoking status: Never    Passive exposure: Yes   Smokeless tobacco: Never  Vaping Use   Vaping Use: Never used  Substance Use Topics  Alcohol use: No    Alcohol/week: 0.0 standard drinks of alcohol    Comment: occ   Drug use: No    Allergies  Allergen Reactions   Latex Itching    No outpatient medications have been marked as taking for the 10/06/21 encounter (Office Visit) with Oliver Barre, MD.    Objective: Ht 5\' 10"  (1.778 m)   Wt 115 lb (52.2 kg)   BMI 16.50 kg/m   Physical Exam:  General: Elderly female. and Alert and oriented.  Seated in a wheelchair Gait: Unable to ambulate.  Evaluation left knee demonstrates diffuse swelling.  Wrinkles are present.   Tenderness to palpation over the mid aspect of the patella.  Limited bruising.  Pain with limited range of motion of the left knee.  Evaluation of the right foot demonstrates bruising and swelling to the right great toe.  Sensation is intact otherwise.  IMAGING: I personally reviewed images previously obtained from the ED  Left knee with a transverse patella fracture.  There is some distraction at the fracture site  Evaluation of the right great toe demonstrates a fracture of the distal phalanx.  Acceptable alignment.   New Medications:  No orders of the defined types were placed in this encounter.     , MD  10/06/2021 10:25 PM

## 2021-10-06 NOTE — Patient Instructions (Signed)
Loretta Stanley  10/06/2021     @   Your procedure is scheduled on  10/08/2021.   Report to [facility] at  0600 A.M.   Call this number if you have problems the morning of surgery:  [phone]   Remember:  Do not eat after midnight.    You may drink clear liquids until  0330 AM on 10/08/2021.      Clear liquids allowed are:                    Water, Juice (non-citric and without pulp - diabetics please choose diet or no sugar options), Carbonated beverages - (diabetics please choose diet or no sugar options), Clear Tea, Black Coffee only (no creamer, milk or cream including half and half), Plain Jell-O only (diabetics please choose diet or no sugar options), Gatorade (diabetics please choose diet or no sugar options), and Plain Popsicles only     At 0330 AM on 10/08/2021 drink your carb drink. You can have nothing else to drink after this.     Take these medicines the morning of surgery with A SIP OF WATER                            tramadol (if needed).     Do not wear jewelry, make-up or nail polish.  Do not wear lotions, powders, or perfumes, or deodorant.  Do not shave 48 hours prior to surgery.  Men may shave face and neck.  Do not bring valuables to the hospital.  [facility] is not responsible for any belongings or valuables.  Contacts, dentures or bridgework may not be worn into surgery.  Leave your suitcase in the car.  After surgery it may be brought to your room.  For patients admitted to the hospital, discharge time will be determined by your treatment team.  Patients discharged the day of surgery will not be allowed to drive home and must have someone with them for 24 hours.    Special instructions:   DO NOT smoke tobacco or vape for 24 hours before your procedure.  Please read over the following fact sheets that you were given. Coughing and Deep Breathing, Surgical Site Infection Prevention, Anesthesia Post-op Instructions,  and Care and Recovery After Surgery      Cast or Splint Care, Adult Casts and splints are supports that are worn to protect broken bones and other injuries. A cast or splint may hold a bone still and in the correct position while it heals. Casts and splints may also help with pain, swelling, and muscle spasms. A cast is a hardened support that is usually made of fiberglass or plaster. It is custom-fit to the body and offers more protection than a splint. Most casts cannot be taken off and put back on. A splint is a type of soft support that is usually made from cloth and elastic. It can be adjusted or taken off as needed. Often, splints are used on broken bones at first. Later, a cast can replace the splint. What are the risks? In some cases, wearing a cast or splint can make it so that less blood gets to the wrist or hand or to the foot and toes. This can happen if there is a lot of swelling or if the cast or splint is too tight. Limited blood supply can cause a problem called compartment syndrome.  This can lead to lasting damage. Symptoms include: Pain that gets worse. Numbness and tingling. Changes in skin color, including paleness or a bluish color. Cold fingers or toes. Other problems from wearing a cast or splint can include: Skin irritation that can cause: Itching. Rash. Skin sores. Skin infection. Limb stiffness or weakness. How to care for a cast or splint that cannot be taken off  Do not put pressure on any part of the cast or splint until it is fully hardened. Do not stick anything inside the cast or splint to scratch your skin. Check the skin around the cast or splint every day. Tell your doctor if you see problems. You may put lotion on dry skin around the cast or splint. Do not put lotion on the skin under the cast or splint. Keep the cast or splint clean and dry. How to care for your splint that you can take off Wear the splint as told by your doctor. Take it off only as  told by your doctor. Check the skin around it every day. Tell your doctor if you see problems. Loosen it if your fingers or toes: Tingle. Become numb. Turn cold and blue. Keep it clean and dry. Clean your splint as told by your doctor. Use mild soap and water and let it air-dry. Do not use heat on the splint. Follow these instructions at home: Bathing Do not take baths, swim, or use a hot tub until your doctor approves. Ask your doctor if you may take showers. You may only be allowed to take sponge baths. If the cast or splint is not waterproof: Do not let it get wet. Cover it with a watertight covering when you take a bath or a shower. Managing pain, stiffness, and swelling  If told, put ice on the affected area. To do this: If you have a cast or splint that can be taken off, take it off as told by your doctor. Put ice in a plastic bag. Place a towel between your skin and the bag or between your cast and the bag. Leave the ice on for 20 minutes, 2-3 times a day. Take off the ice if your skin turns bright red. This is very important. If you cannot feel pain, heat, or cold, you have a greater risk of damage to the area. Move your fingers or toes often. Raise the injured area above the level of your heart while you are sitting or lying down. Safety Do not use your injured leg or foot to support your body weight until your doctor says that you can. Use crutches or other helpful (assistive) devices as told by your doctor. Ask your doctor when it is safe to drive if you have a cast or splint on part of your body. General instructions Take over-the-counter and prescription medicines only as told by your doctor. Return to your normal activities as told by your doctor. Ask your doctor what activities are safe for you. Keep all follow-up visits. This is important. Contact a doctor if: The skin around the cast or splint gets red or raw. The skin under the cast is very itchy or painful. Your  cast or splint: Gets damaged. Feels very uncomfortable. Is too tight or too loose. Your cast becomes wet or it starts to have a soft spot or area. There is a bad smell coming from under your cast. You get an object stuck under your cast. Get help right away if: You get any symptoms of compartment  syndrome, such as: Very bad pain or pressure under the cast. Numbness, tingling, coldness, or pale or bluish skin. The part of your body above or below the cast is swollen, and it turns a different color (is discolored). You cannot feel or move your fingers or toes. Your pain gets worse. There is fluid leaking through the cast. You have trouble breathing or shortness of breath. You have chest pain. These symptoms may be an emergency. Get help right away. Call your local emergency services (911 in the [place] Do not wait to see if the symptoms will go away. Do not drive yourself to the hospital. Summary Casts and splints are worn to protect broken bones and other injuries. Keep your cast or splint clean and dry. Take off your cast or splint only as told by your doctor. Get help right away if you have very bad pain, numbness, tingling, or skin that turns cold or another color. This information is not intended to replace advice given to you by your health care provider. Make sure you discuss any questions you have with your health care provider. Document Revised: 09/30/2020 Document Reviewed: 09/30/2020 Elsevier Patient Education  2023 [employer] General Anesthesia, Adult, Care After This sheet gives you information about how to care for yourself after your procedure. Your health care provider may also give you more specific instructions. If you have problems or questions, contact your health care provider. What can I expect after the procedure? After the procedure, the following side effects are common: Pain or discomfort at the IV site. Nausea. Vomiting. Sore throat. Trouble  concentrating. Feeling cold or chills. Feeling weak or tired. Sleepiness and fatigue. Soreness and body aches. These side effects can affect parts of the body that were not involved in surgery. Follow these instructions at home: For the time period you were told by your health care provider:  Rest. Do not participate in activities where you could fall or become injured. Do not drive or use machinery. Do not drink alcohol. Do not take sleeping pills or medicines that cause drowsiness. Do not make important decisions or sign legal documents. Do not take care of children on your own. Eating and drinking Follow any instructions from your health care provider about eating or drinking restrictions. When you feel hungry, start by eating small amounts of foods that are soft and easy to digest (bland), such as toast. Gradually return to your regular diet. Drink enough fluid to keep your urine pale yellow. If you vomit, rehydrate by drinking water, juice, or clear broth. General instructions If you have sleep apnea, surgery and certain medicines can increase your risk for breathing problems. Follow instructions from your health care provider about wearing your sleep device: Anytime you are sleeping, including during daytime naps. While taking prescription pain medicines, sleeping medicines, or medicines that make you drowsy. Have a responsible adult stay with you for the time you are told. It is important to have someone help care for you until you are awake and alert. Return to your normal activities as told by your health care provider. Ask your health care provider what activities are safe for you. Take over-the-counter and prescription medicines only as told by your health care provider. If you smoke, do not smoke without supervision. Keep all follow-up visits as told by your health care provider. This is important. Contact a health care provider if: You have nausea or vomiting that does not  get better with medicine. You cannot eat or drink without  vomiting. You have pain that does not get better with medicine. You are unable to pass urine. You develop a skin rash. You have a fever. You have redness around your IV site that gets worse. Get help right away if: You have difficulty breathing. You have chest pain. You have blood in your urine or stool, or you vomit blood. Summary After the procedure, it is common to have a sore throat or nausea. It is also common to feel tired. Have a responsible adult stay with you for the time you are told. It is important to have someone help care for you until you are awake and alert. When you feel hungry, start by eating small amounts of foods that are soft and easy to digest (bland), such as toast. Gradually return to your regular diet. Drink enough fluid to keep your urine pale yellow. Return to your normal activities as told by your health care provider. Ask your health care provider what activities are safe for you. This information is not intended to replace advice given to you by your health care provider. Make sure you discuss any questions you have with your health care provider. Document Revised: 12/20/2019 Document Reviewed: 07/19/2019 Elsevier Patient Education  2023 [employer] How to Use Chlorhexidine for Bathing Chlorhexidine gluconate (CHG) is a germ-killing (antiseptic) solution that is used to clean the skin. It can get rid of the bacteria that normally live on the skin and can keep them away for about 24 hours. To clean your skin with CHG, you may be given: A CHG solution to use in the shower or as part of a sponge bath. A prepackaged cloth that contains CHG. Cleaning your skin with CHG may help lower the risk for infection: While you are staying in the intensive care unit of the hospital. If you have a vascular access, such as a central line, to provide short-term or long-term access to your veins. If you have a catheter  to drain urine from your bladder. If you are on a ventilator. A ventilator is a machine that helps you breathe by moving air in and out of your lungs. After surgery. What are the risks? Risks of using CHG include: A skin reaction. Hearing loss, if CHG gets in your ears and you have a perforated eardrum. Eye injury, if CHG gets in your eyes and is not rinsed out. The CHG product catching fire. Make sure that you avoid smoking and flames after applying CHG to your skin. Do not use CHG: If you have a chlorhexidine allergy or have previously reacted to chlorhexidine. On babies younger than 37 months of age. How to use CHG solution Use CHG only as told by your health care provider, and follow the instructions on the label. Use the full amount of CHG as directed. Usually, this is one bottle. During a shower Follow these steps when using CHG solution during a shower (unless your health care provider gives you different instructions): Start the shower. Use your normal soap and shampoo to wash your face and hair. Turn off the shower or move out of the shower stream. Pour the CHG onto a clean washcloth. Do not use any type of brush or rough-edged sponge. Starting at your neck, lather your body down to your toes. Make sure you follow these instructions: If you will be having surgery, pay special attention to the part of your body where you will be having surgery. Scrub this area for at least 1 minute. Do not use  CHG on your head or face. If the solution gets into your ears or eyes, rinse them well with water. Avoid your genital area. Avoid any areas of skin that have broken skin, cuts, or scrapes. Scrub your back and under your arms. Make sure to wash skin folds. Let the lather sit on your skin for 1-2 minutes or as long as told by your health care provider. Thoroughly rinse your entire body in the shower. Make sure that all body creases and crevices are rinsed well. Dry off with a clean towel. Do  not put any substances on your body afterward--such as powder, lotion, or perfume--unless you are told to do so by your health care provider. Only use lotions that are recommended by the manufacturer. Put on clean clothes or pajamas. If it is the night before your surgery, sleep in clean sheets.  During a sponge bath Follow these steps when using CHG solution during a sponge bath (unless your health care provider gives you different instructions): Use your normal soap and shampoo to wash your face and hair. Pour the CHG onto a clean washcloth. Starting at your neck, lather your body down to your toes. Make sure you follow these instructions: If you will be having surgery, pay special attention to the part of your body where you will be having surgery. Scrub this area for at least 1 minute. Do not use CHG on your head or face. If the solution gets into your ears or eyes, rinse them well with water. Avoid your genital area. Avoid any areas of skin that have broken skin, cuts, or scrapes. Scrub your back and under your arms. Make sure to wash skin folds. Let the lather sit on your skin for 1-2 minutes or as long as told by your health care provider. Using a different clean, wet washcloth, thoroughly rinse your entire body. Make sure that all body creases and crevices are rinsed well. Dry off with a clean towel. Do not put any substances on your body afterward--such as powder, lotion, or perfume--unless you are told to do so by your health care provider. Only use lotions that are recommended by the manufacturer. Put on clean clothes or pajamas. If it is the night before your surgery, sleep in clean sheets. How to use CHG prepackaged cloths Only use CHG cloths as told by your health care provider, and follow the instructions on the label. Use the CHG cloth on clean, dry skin. Do not use the CHG cloth on your head or face unless your health care provider tells you to. When washing with the CHG  cloth: Avoid your genital area. Avoid any areas of skin that have broken skin, cuts, or scrapes. Before surgery Follow these steps when using a CHG cloth to clean before surgery (unless your health care provider gives you different instructions): Using the CHG cloth, vigorously scrub the part of your body where you will be having surgery. Scrub using a back-and-forth motion for 3 minutes. The area on your body should be completely wet with CHG when you are done scrubbing. Do not rinse. Discard the cloth and let the area air-dry. Do not put any substances on the area afterward, such as powder, lotion, or perfume. Put on clean clothes or pajamas. If it is the night before your surgery, sleep in clean sheets.  For general bathing Follow these steps when using CHG cloths for general bathing (unless your health care provider gives you different instructions). Use a separate CHG cloth for  each area of your body. Make sure you wash between any folds of skin and between your fingers and toes. Wash your body in the following order, switching to a new cloth after each step: The front of your neck, shoulders, and chest. Both of your arms, under your arms, and your hands. Your stomach and groin area, avoiding the genitals. Your right leg and foot. Your left leg and foot. The back of your neck, your back, and your buttocks. Do not rinse. Discard the cloth and let the area air-dry. Do not put any substances on your body afterward--such as powder, lotion, or perfume--unless you are told to do so by your health care provider. Only use lotions that are recommended by the manufacturer. Put on clean clothes or pajamas. Contact a health care provider if: Your skin gets irritated after scrubbing. You have questions about using your solution or cloth. You swallow any chlorhexidine. Call your local poison control center ((639)269-2226 in the [place] Get help right away if: Your eyes itch badly, or they become  very red or swollen. Your skin itches badly and is red or swollen. Your hearing changes. You have trouble seeing. You have swelling or tingling in your mouth or throat. You have trouble breathing. These symptoms may represent a serious problem that is an emergency. Do not wait to see if the symptoms will go away. Get medical help right away. Call your local emergency services (911 in the [place] Do not drive yourself to the hospital. Summary Chlorhexidine gluconate (CHG) is a germ-killing (antiseptic) solution that is used to clean the skin. Cleaning your skin with CHG may help to lower your risk for infection. You may be given CHG to use for bathing. It may be in a bottle or in a prepackaged cloth to use on your skin. Carefully follow your health care provider's instructions and the instructions on the product label. Do not use CHG if you have a chlorhexidine allergy. Contact your health care provider if your skin gets irritated after scrubbing. This information is not intended to replace advice given to you by your health care provider. Make sure you discuss any questions you have with your health care provider. Document Revised: 06/16/2020 Document Reviewed: 06/16/2020 Elsevier Patient Education  2023 [employer].

## 2021-10-07 ENCOUNTER — Encounter (HOSPITAL_COMMUNITY)
Admission: RE | Admit: 2021-10-07 | Discharge: 2021-10-07 | Disposition: A | Discharge status: Home / Self Care | Payer: 59 | Source: Ambulatory Visit | Attending: Orthopedic Surgery | Admitting: Orthopedic Surgery

## 2021-10-07 ENCOUNTER — Encounter (HOSPITAL_COMMUNITY): Payer: Self-pay

## 2021-10-07 VITALS — BP 147/75 | HR 71 | Temp 97.9°F | Resp 18 | Ht 70.0 in | Wt 115.0 lb

## 2021-10-07 DIAGNOSIS — Z01818 Encounter for other preprocedural examination: Secondary | ICD-10-CM | POA: Diagnosis present

## 2021-10-07 DIAGNOSIS — R9431 Abnormal electrocardiogram [ECG] [EKG]: Secondary | ICD-10-CM | POA: Diagnosis not present

## 2021-10-07 DIAGNOSIS — I10 Essential (primary) hypertension: Secondary | ICD-10-CM

## 2021-10-07 HISTORY — DX: Depression, unspecified: F32.A

## 2021-10-07 HISTORY — DX: Unspecified osteoarthritis, unspecified site: M19.90

## 2021-10-07 HISTORY — DX: Irritable bowel syndrome, unspecified: K58.9

## 2021-10-07 LAB — BASIC METABOLIC PANEL
Anion gap: 6 (ref 5–15)
BUN: 13 mg/dL (ref 8–23)
CO2: 28 mmol/L (ref 22–32)
Calcium: 9 mg/dL (ref 8.9–10.3)
Chloride: 98 mmol/L (ref 98–111)
Creatinine, Ser: 0.6 mg/dL (ref 0.44–1.00)
GFR, Estimated: >60 mL/min (ref >60)
Glucose, Bld: 88 mg/dL (ref 70–99)
Potassium: 4 mmol/L (ref 3.5–5.1)
Sodium: 132 mmol/L — ABNORMAL LOW (ref 135–145)

## 2021-10-07 LAB — MAGNESIUM: Magnesium: 1.8 mg/dL (ref 1.7–2.4)

## 2021-10-07 LAB — CBC
HCT: 32.5 % — ABNORMAL LOW (ref 36.0–46.0)
Hemoglobin: 10.9 g/dL — ABNORMAL LOW (ref 12.0–15.0)
MCH: 30.5 pg (ref 26.0–34.0)
MCHC: 33.5 g/dL (ref 30.0–36.0)
MCV: 91 fL (ref 80.0–100.0)
Platelets: 177 10*3/uL (ref 150–400)
RBC: 3.57 MIL/uL — ABNORMAL LOW (ref 3.87–5.11)
RDW: 12.3 % (ref 11.5–15.5)
WBC: 5 10*3/uL (ref 4.0–10.5)
nRBC: 0 % (ref 0.0–0.2)

## 2021-10-08 ENCOUNTER — Ambulatory Visit (HOSPITAL_COMMUNITY): Payer: 59

## 2021-10-08 ENCOUNTER — Other Ambulatory Visit: Payer: Self-pay

## 2021-10-08 ENCOUNTER — Ambulatory Visit (HOSPITAL_BASED_OUTPATIENT_CLINIC_OR_DEPARTMENT_OTHER): Payer: 59

## 2021-10-08 ENCOUNTER — Encounter (HOSPITAL_COMMUNITY)
Admission: RE | Disposition: A | Discharge status: Home / Self Care | Payer: Self-pay | Source: Home / Self Care | Attending: Orthopedic Surgery

## 2021-10-08 ENCOUNTER — Ambulatory Visit (HOSPITAL_COMMUNITY)
Admission: RE | Admit: 2021-10-08 | Discharge: 2021-10-08 | Disposition: A | Discharge status: Home / Self Care | Payer: 59 | Attending: Orthopedic Surgery | Admitting: Orthopedic Surgery

## 2021-10-08 DIAGNOSIS — M1990 Unspecified osteoarthritis, unspecified site: Secondary | ICD-10-CM | POA: Diagnosis not present

## 2021-10-08 DIAGNOSIS — S82032A Displaced transverse fracture of left patella, initial encounter for closed fracture: Secondary | ICD-10-CM | POA: Diagnosis present

## 2021-10-08 DIAGNOSIS — I10 Essential (primary) hypertension: Secondary | ICD-10-CM | POA: Diagnosis not present

## 2021-10-08 DIAGNOSIS — S82002A Unspecified fracture of left patella, initial encounter for closed fracture: Secondary | ICD-10-CM

## 2021-10-08 DIAGNOSIS — W19XXXA Unspecified fall, initial encounter: Secondary | ICD-10-CM | POA: Diagnosis not present

## 2021-10-08 HISTORY — PX: ORIF PATELLA: SHX5033

## 2021-10-08 SURGERY — OPEN REDUCTION INTERNAL FIXATION (ORIF) PATELLA
Anesthesia: General | Site: Knee | Laterality: Left

## 2021-10-08 MED ORDER — FENTANYL CITRATE (PF) 100 MCG/2ML IJ SOLN
INTRAMUSCULAR | Status: AC
  Filled 2021-10-08: qty 2

## 2021-10-08 MED ORDER — HYDROMORPHONE HCL 1 MG/ML IJ SOLN: 0.2500 mg | INTRAMUSCULAR | Status: DC | PRN

## 2021-10-08 MED ORDER — CELECOXIB 100 MG PO CAPS: 100.0000 mg | ORAL_CAPSULE | Freq: Every day | ORAL | 0 refills | Status: AC

## 2021-10-08 MED ORDER — LIDOCAINE HCL (CARDIAC) PF 100 MG/5ML IV SOSY
PREFILLED_SYRINGE | INTRAVENOUS | Status: DC | PRN
  Administered 2021-10-08: 40 mg via INTRAVENOUS

## 2021-10-08 MED ORDER — DEXAMETHASONE SODIUM PHOSPHATE 4 MG/ML IJ SOLN
INTRAMUSCULAR | Status: DC | PRN
  Administered 2021-10-08: 4 mg via PERINEURAL

## 2021-10-08 MED ORDER — PROPOFOL 10 MG/ML IV BOLUS
INTRAVENOUS | Status: AC
  Filled 2021-10-08: qty 20

## 2021-10-08 MED ORDER — BUPIVACAINE-EPINEPHRINE (PF) 0.5% -1:200000 IJ SOLN
INTRAMUSCULAR | Status: AC
  Filled 2021-10-08: qty 30

## 2021-10-08 MED ORDER — ASPIRIN 81 MG PO TBEC: 81.0000 mg | DELAYED_RELEASE_TABLET | Freq: Two times a day (BID) | ORAL | 0 refills | Status: AC

## 2021-10-08 MED ORDER — ONDANSETRON HCL 4 MG/2ML IJ SOLN
INTRAMUSCULAR | Status: AC
  Filled 2021-10-08: qty 2

## 2021-10-08 MED ORDER — CHLORHEXIDINE GLUCONATE 0.12 % MT SOLN: 15.0000 mL | Freq: Once | OROMUCOSAL | Status: DC

## 2021-10-08 MED ORDER — LIDOCAINE HCL (PF) 1 % IJ SOLN
INTRAMUSCULAR | Status: AC
  Filled 2021-10-08: qty 30

## 2021-10-08 MED ORDER — PHENYLEPHRINE 80 MCG/ML (10ML) SYRINGE FOR IV PUSH (FOR BLOOD PRESSURE SUPPORT)
PREFILLED_SYRINGE | INTRAVENOUS | Status: AC
  Filled 2021-10-08: qty 10

## 2021-10-08 MED ORDER — CEFAZOLIN SODIUM-DEXTROSE 2-4 GM/100ML-% IV SOLN
2.0000 g | INTRAVENOUS | Status: AC
  Administered 2021-10-08: 2 g via INTRAVENOUS

## 2021-10-08 MED ORDER — OXYCODONE HCL 5 MG PO TABS: 5.0000 mg | ORAL_TABLET | ORAL | 0 refills | Status: DC | PRN

## 2021-10-08 MED ORDER — ONDANSETRON HCL 4 MG PO TABS: 4.0000 mg | ORAL_TABLET | Freq: Three times a day (TID) | ORAL | 0 refills | Status: AC | PRN

## 2021-10-08 MED ORDER — CEFAZOLIN SODIUM-DEXTROSE 2-4 GM/100ML-% IV SOLN
INTRAVENOUS | Status: AC
  Filled 2021-10-08: qty 100

## 2021-10-08 MED ORDER — SODIUM CHLORIDE 0.9 % IR SOLN
Status: DC | PRN
  Administered 2021-10-08: 1000 mL

## 2021-10-08 MED ORDER — ROPIVACAINE HCL 5 MG/ML IJ SOLN
INTRAMUSCULAR | Status: DC | PRN
  Administered 2021-10-08: 25 mL via PERINEURAL

## 2021-10-08 MED ORDER — ONDANSETRON HCL 4 MG/2ML IJ SOLN: 4.0000 mg | Freq: Once | INTRAMUSCULAR | Status: DC | PRN

## 2021-10-08 MED ORDER — EPHEDRINE 5 MG/ML INJ
INTRAVENOUS | Status: AC
  Filled 2021-10-08: qty 5

## 2021-10-08 MED ORDER — LIDOCAINE HCL (PF) 2 % IJ SOLN
INTRAMUSCULAR | Status: AC
  Filled 2021-10-08: qty 5

## 2021-10-08 MED ORDER — ACETAMINOPHEN 500 MG PO TABS: 1000.0000 mg | ORAL_TABLET | Freq: Three times a day (TID) | ORAL | 0 refills | Status: AC

## 2021-10-08 MED ORDER — LACTATED RINGERS IV SOLN: INTRAVENOUS | Status: DC

## 2021-10-08 MED ORDER — PROPOFOL 10 MG/ML IV BOLUS
INTRAVENOUS | Status: DC | PRN
  Administered 2021-10-08: 120 mg via INTRAVENOUS

## 2021-10-08 MED ORDER — EPHEDRINE SULFATE (PRESSORS) 50 MG/ML IJ SOLN
INTRAMUSCULAR | Status: DC | PRN
  Administered 2021-10-08 (×5): 5 mg via INTRAVENOUS

## 2021-10-08 MED ORDER — SEVOFLURANE IN SOLN
RESPIRATORY_TRACT | Status: AC
  Filled 2021-10-08: qty 250

## 2021-10-08 MED ORDER — FENTANYL CITRATE (PF) 100 MCG/2ML IJ SOLN
INTRAMUSCULAR | Status: DC | PRN
  Administered 2021-10-08: 25 ug via INTRAVENOUS
  Administered 2021-10-08: 50 ug via INTRAVENOUS
  Administered 2021-10-08: 25 ug via INTRAVENOUS

## 2021-10-08 MED ORDER — ROPIVACAINE HCL 5 MG/ML IJ SOLN
INTRAMUSCULAR | Status: AC
  Filled 2021-10-08: qty 30

## 2021-10-08 MED ORDER — ORAL CARE MOUTH RINSE: 15.0000 mL | Freq: Once | OROMUCOSAL | Status: DC

## 2021-10-08 MED ORDER — DEXAMETHASONE SODIUM PHOSPHATE 10 MG/ML IJ SOLN
INTRAMUSCULAR | Status: DC | PRN
  Administered 2021-10-08: 5 mg via INTRAVENOUS

## 2021-10-08 MED ORDER — DEXAMETHASONE SODIUM PHOSPHATE 10 MG/ML IJ SOLN
INTRAMUSCULAR | Status: AC
  Filled 2021-10-08: qty 1

## 2021-10-08 MED ORDER — DEXAMETHASONE SODIUM PHOSPHATE 4 MG/ML IJ SOLN
INTRAMUSCULAR | Status: AC
  Filled 2021-10-08: qty 1

## 2021-10-08 MED ORDER — PHENYLEPHRINE 80 MCG/ML (10ML) SYRINGE FOR IV PUSH (FOR BLOOD PRESSURE SUPPORT)
PREFILLED_SYRINGE | INTRAVENOUS | Status: DC | PRN
  Administered 2021-10-08 (×6): 80 ug via INTRAVENOUS

## 2021-10-08 MED ORDER — LIDOCAINE HCL (PF) 1 % IJ SOLN
INTRAMUSCULAR | Status: DC | PRN
  Administered 2021-10-08: 3 mL

## 2021-10-08 MED ORDER — ONDANSETRON HCL 4 MG/2ML IJ SOLN
INTRAMUSCULAR | Status: DC | PRN
  Administered 2021-10-08: 4 mg via INTRAVENOUS

## 2021-10-08 SURGICAL SUPPLY — 70 items
APL PRP STRL LF DISP 70% ISPRP (MISCELLANEOUS) ×1
BANDAGE ELASTIC 4 VELCRO NS (GAUZE/BANDAGES/DRESSINGS) ×1 IMPLANT
BANDAGE ELASTIC 6 VELCRO NS (GAUZE/BANDAGES/DRESSINGS) ×1 IMPLANT
BANDAGE ESMARK 6X9 LF (GAUZE/BANDAGES/DRESSINGS) ×1 IMPLANT
BIT DRILL 2.6 CANN (BIT) ×1 IMPLANT
BLADE SURG 15 STRL LF DISP TIS (BLADE) IMPLANT
BLADE SURG 15 STRL SS (BLADE) ×2
BLADE SURG SZ10 CARB STEEL (BLADE) ×2 IMPLANT
BNDG CMPR 9X6 STRL LF SNTH (GAUZE/BANDAGES/DRESSINGS) ×1
BNDG CMPR STD VLCR NS LF 5.8X4 (GAUZE/BANDAGES/DRESSINGS) ×2
BNDG CMPR STD VLCR NS LF 5.8X6 (GAUZE/BANDAGES/DRESSINGS) ×1
BNDG COHESIVE 4X5 TAN STRL (GAUZE/BANDAGES/DRESSINGS) ×2 IMPLANT
BNDG ELASTIC 4X5.8 VLCR NS LF (GAUZE/BANDAGES/DRESSINGS) ×4 IMPLANT
BNDG ELASTIC 6X5.8 VLCR NS LF (GAUZE/BANDAGES/DRESSINGS) ×2 IMPLANT
BNDG ESMARK 6X9 LF (GAUZE/BANDAGES/DRESSINGS) ×2
BRACE T-SCOPE KNEE POSTOP (MISCELLANEOUS) ×1 IMPLANT
CHLORAPREP W/TINT 26 (MISCELLANEOUS) ×2 IMPLANT
CLOTH BEACON ORANGE TIMEOUT ST (SAFETY) ×2 IMPLANT
COOLER ICEMAN CLASSIC (MISCELLANEOUS) ×1 IMPLANT
COVER LIGHT HANDLE STERIS (MISCELLANEOUS) ×4 IMPLANT
CUFF TOURN SGL QUICK 24 (TOURNIQUET CUFF) ×2
CUFF TRNQT CYL 24X4X16.5-23 (TOURNIQUET CUFF) IMPLANT
DRAPE C-ARM FOLDED MOBILE STRL (DRAPES) ×2 IMPLANT
DRAPE C-ARMOR (DRAPES) ×1 IMPLANT
DRAPE HALF SHEET 40X57 (DRAPES) ×4 IMPLANT
DRAPE INCISE IOBAN 66X45 STRL (DRAPES) ×2 IMPLANT
DRAPE ORTHO 2.5IN SPLIT 77X108 (DRAPES) IMPLANT
DRAPE ORTHO SPLIT 77X108 STRL (DRAPES) ×2
ELECT REM PT RETURN 9FT ADLT (ELECTROSURGICAL) ×2
ELECTRODE REM PT RTRN 9FT ADLT (ELECTROSURGICAL) ×1 IMPLANT
FIBERTAPE 2 W/STRL NDL 17 (SUTURE) ×1 IMPLANT
FIBERTAPE CERCLAGE TLINK SUT (SUTURE) ×1 IMPLANT
GAUZE SPONGE 4X4 12PLY STRL (GAUZE/BANDAGES/DRESSINGS) ×1 IMPLANT
GLOVE BIOGEL PI IND STRL 6.5 (GLOVE) IMPLANT
GLOVE BIOGEL PI IND STRL 7.0 (GLOVE) ×2 IMPLANT
GLOVE BIOGEL PI INDICATOR 6.5 (GLOVE) ×1
GLOVE BIOGEL PI INDICATOR 7.0 (GLOVE) ×2
GLOVE SRG 8 PF TXTR STRL LF DI (GLOVE) ×1 IMPLANT
GLOVE SURG POLYISO LF SZ8 (GLOVE) ×6 IMPLANT
GLOVE SURG SS PI 6.5 STRL IVOR (GLOVE) ×2 IMPLANT
GLOVE SURG UNDER POLY LF SZ8 (GLOVE) ×2
GOWN STRL REUS W/ TWL XL LVL3 (GOWN DISPOSABLE) ×1 IMPLANT
GOWN STRL REUS W/TWL LRG LVL3 (GOWN DISPOSABLE) ×4 IMPLANT
GOWN STRL REUS W/TWL XL LVL3 (GOWN DISPOSABLE) ×2
GUIDEWIRE 1.35MM  DUAL TROCAR (WIRE) ×4
GUIDEWIRE 1.35MM DUAL TROCAR (WIRE) IMPLANT
INST SET MINOR BONE (KITS) ×2 IMPLANT
KIT TURNOVER KIT A (KITS) ×2 IMPLANT
MANIFOLD NEPTUNE II (INSTRUMENTS) ×2 IMPLANT
NS IRRIG 1000ML POUR BTL (IV SOLUTION) ×2 IMPLANT
PACK BASIC LIMB (CUSTOM PROCEDURE TRAY) ×2 IMPLANT
PAD ABD 5X9 TENDERSORB (GAUZE/BANDAGES/DRESSINGS) ×1 IMPLANT
PAD ARMBOARD 7.5X6 YLW CONV (MISCELLANEOUS) ×2 IMPLANT
PAD COLD SHLDR SM WRAP-ON (PAD) ×1 IMPLANT
PADDING CAST COTTON 6X4 STRL (CAST SUPPLIES) ×1 IMPLANT
PASSER SUT SWANSON 36MM LOOP (INSTRUMENTS) ×1 IMPLANT
SCREW CANN BLUNT TIP 4X38 LP (Screw) ×1 IMPLANT
SCREW CANN BLUNT TIP LP 4X42 (Screw) ×1 IMPLANT
SET BASIN LINEN APH (SET/KITS/TRAYS/PACK) ×2 IMPLANT
SPONGE GAUZE 4X4 12PLY (GAUZE/BANDAGES/DRESSINGS) ×1 IMPLANT
SPONGE T-LAP 18X18 ~~LOC~~+RFID (SPONGE) ×3 IMPLANT
STAPLER VISISTAT (STAPLE) ×2 IMPLANT
STRIP CLOSURE SKIN 1/2X4 (GAUZE/BANDAGES/DRESSINGS) ×1 IMPLANT
SUT MNCRL AB 4-0 PS2 18 (SUTURE) ×1 IMPLANT
SUT MON AB 2-0 CT1 36 (SUTURE) ×2 IMPLANT
SUT VIC AB 0 CT1 27 (SUTURE) ×2
SUT VIC AB 0 CT1 27XBRD ANTBC (SUTURE) IMPLANT
SUT VICRYL 4 0 KS 27 (SUTURE) ×1 IMPLANT
SYR 20ML LL LF (SYRINGE) ×3 IMPLANT
SYR BULB IRRIG 60ML STRL (SYRINGE) ×2 IMPLANT

## 2021-10-08 NOTE — Anesthesia Preprocedure Evaluation (Signed)
Anesthesia Evaluation  Patient identified by MRN, date of birth, ID band Patient awake    Reviewed: Allergy & Precautions, NPO status , Patient's Chart, lab work & pertinent test results  Airway Mallampati: II  TM Distance: >3 FB Neck ROM: Full    Dental  (+) Dental Advisory Given, Edentulous Upper, Missing   Pulmonary neg pulmonary ROS,    Pulmonary exam normal breath sounds clear to auscultation       Cardiovascular Exercise Tolerance: Good hypertension, Pt. on medications  Rhythm:Irregular Rate:Normal  07-Oct-2021 13:28:54 [facility]-OPS ROUTINE RECORD 30-May-1945 (76 yr) Female Caucasian Vent. rate 82 BPM PR interval 232 ms QRS duration 90 ms QT/QTcB 374/436 ms P-R-T axes 65 90 49 Sinus rhythm with sinus arrhythmia with 1st degree A-V block with frequent Premature ventricular complexes New since previous tracing Rightward axis [id number] now evident Clinical correlation recommended for pulmonary embolism Septal infarct , age undetermined Abnormal ECG When compared with ECG of 25-Aug-2016 10:06, PREVIOUS ECG IS PRESENT Confirmed by Sherryl Manges [phone]) on 10/07/2021 1:48:46 PM Referred by: Pearson Grippe Confirmed By: Sherryl Manges   Neuro/Psych PSYCHIATRIC DISORDERS Depression negative neurological ROS     GI/Hepatic negative GI ROS, Neg liver ROS,   Endo/Other  negative endocrine ROS  Renal/GU negative Renal ROS  negative genitourinary   Musculoskeletal  (+) Arthritis , Osteoarthritis,    Abdominal   Peds negative pediatric ROS (+)  Hematology negative hematology ROS (+)   Anesthesia Other Findings   Reproductive/Obstetrics negative OB ROS                            Anesthesia Physical Anesthesia Plan  ASA: 3  Anesthesia Plan: General   Post-op Pain Management: Regional block* and Dilaudid IV   Induction: Intravenous  PONV Risk Score and Plan: Ondansetron  and Dexamethasone  Airway Management Planned: LMA  Additional Equipment:   Intra-op Plan:   Post-operative Plan: Extubation in OR  Informed Consent: I have reviewed the patients History and Physical, chart, labs and discussed the procedure including the risks, benefits and alternatives for the proposed anesthesia with the patient or authorized representative who has indicated his/her understanding and acceptance.       Plan Discussed with: CRNA and Surgeon  Anesthesia Plan Comments:        Anesthesia Quick Evaluation

## 2021-10-08 NOTE — Addendum Note (Signed)
Addendum  created 10/08/21 1241 by Lorin Glass, CRNA   Flowsheet accepted, Intraprocedure Flowsheets edited

## 2021-10-08 NOTE — Anesthesia Procedure Notes (Signed)
Procedure Name: LMA Insertion Date/Time: 10/08/2021 7:46 AM  Performed by: Lorin Glass, CRNAPre-anesthesia Checklist: Emergency Drugs available, Patient identified, Suction available and Patient being monitored Patient Re-evaluated:Patient Re-evaluated prior to induction Oxygen Delivery Method: Circle system utilized Preoxygenation: Pre-oxygenation with 100% oxygen Induction Type: IV induction LMA: LMA inserted LMA Size: 3.0 Number of attempts: 1 Placement Confirmation: positive ETCO2 and breath sounds checked- equal and bilateral Tube secured with: Tape Dental Injury: Teeth and Oropharynx as per pre-operative assessment

## 2021-10-08 NOTE — Anesthesia Postprocedure Evaluation (Signed)
Anesthesia Post Note  Patient: Loretta Stanley  Procedure(s) Performed: OPEN REDUCTION INTERNAL (ORIF) FIXATION PATELLA (Left: Knee)  Patient location during evaluation: PACU Anesthesia Type: General Level of consciousness: awake and alert and oriented Pain management: pain level controlled Vital Signs Assessment: post-procedure vital signs reviewed and stable Respiratory status: spontaneous breathing, nonlabored ventilation and respiratory function stable Cardiovascular status: blood pressure returned to baseline and stable Postop Assessment: no apparent nausea or vomiting Anesthetic complications: no   No notable events documented.   Last Vitals:  Vitals:   10/08/21 1046 10/08/21 1049  BP: 135/87 135/87  Pulse: 79 82  Resp: 16 16  Temp:  36.6 C  SpO2: 100% 100%    Last Pain:  Vitals:   10/08/21 1049  TempSrc: Oral  PainSc:                  Mycah Mcdougall C Raziya Aveni

## 2021-10-08 NOTE — Op Note (Signed)
Orthopaedic Surgery Operative Note (CSN: [id number])  Loretta Stanley  May 31, 1945 Date of Surgery: 10/08/2021   Diagnoses:  Left patella fracture  Procedure: ORIF left patella fracture    Operative Finding Successful completion of the planned procedure.  Two cannulated screws with suture tape in figure of 8 configuration to complete the tension band technique.  No gapping of the fracture with flexion to 90 degrees.    Post-Op Diagnosis: Same Surgeons:Primary: Oliver Barre, MD Assistants:  Westly Pam Location: [facility] OR ROOM 4 Anesthesia: General with regional anesthesia Antibiotics: Ancef 2 g Tourniquet time:  Total Tourniquet Time Documented: Thigh (Left) - 99 minutes Total: Thigh (Left) - 99 minutes  Estimated Blood Loss: 30 cc Complications: None Specimens: None Implants: Implant Name Type Inv. Item Serial No. Manufacturer Lot No. LRB No. Used Action  SCREW CANN BLUNT TIP 4X38 LP - [id number] Screw SCREW CANN BLUNT TIP 4X38 LP  [employer] STERILE ON SET Left 1 Implanted  SCREW CANN BLUNT TIP LP 4X42 - [id number] Screw SCREW CANN BLUNT TIP LP 4X42  [employer] STERILE ON SET Left 1 Implanted    Indications for Surgery:   Loretta Stanley is a 76 y.o. female who fell and sustained a transverse left patella fracture.  In order to restore form and function, I have recommended operative fixation.  Benefits and risks of operative and nonoperative management were discussed prior to surgery with the patient and informed consent form was completed.  Specific risks including infection, need for additional surgery, nonunion, malunion, persistent pain, stiffness and more severe complications associated with anesthesia were discussed.  She elected to proceed.  Surgical consent was finalized.    Procedure:   The patient was identified properly. Informed consent was obtained and the surgical site was marked. The patient was taken to the OR where general anesthesia was induced.  The patient was  positioned supine, with the left leg on bone foam.  The left leg was prepped and draped in the usual sterile fashion.  Timeout was performed before the beginning of the case.  Tourniquet was used for the above duration.  We planned a longitudinal incision, centered over the patella.  The incision was carried directly through the subcutaneous tissue.  We used a combination of electrocautery and blunt dissection to expose the patellar tendon distally, as well as the quadriceps tendon proximally.  We then identified the transverse fracture.  A small amount of hematoma was expressed.  In addition, there was some joint fluid that was expressed.  The medial retinaculum was intact, but there was a small rent in the lateral retinaculum which was extended in order to assist with palpation of the patella, and aid in reduction.   The fracture was then distracted, and early callus formation was removed from the fracture ends.  Once the bony ends were free, and back to bleeding bone we plan to reduce the fracture.  We used a large point-to-point clamp and compressed the fracture at the midpoint of the proximal pole, as well as the distal pole.  Fluoroscopy confirmed excellent reduction.    We then proceeded to place 2 K wires through both fragments, in antegrade fashion.  These were parallel to one another, and perpendicular to the fracture site.  Using fluoroscopy, orthogonal images confirmed adequate placement of the K wires, and did not breach the joint line.  Appropriate length screws were selected based on measurements.  We then proceeded to drill over the K wires.  2 screws were  then placed over the K wire in antegrade fashion.  We were able to achieve some compression of the fracture site using the partially-threaded cannulated screws.    A fiber tape was then introduced through the cannulated screws, using the soft tissue protector and following the trajectory of the existing K wires.  The fiber tape was placed  through the cannulated screws in a figure of 8 configuration.  All tension was removed from the suture, and this was provisionally secured with a clamp.  Fluoroscopic images confirmed compression across the fracture site.  We then proceeded to tie the tape by hand.  Once again, we confirmed reduction under fluoroscopy.  We then placed multiple half hitches in the fiber tape to secure it.  The fiber tape was then truncated.  Final fluoroscopic imaging confirmed reduction of the fracture, with appropriately placed hardware.   . We then gently ranged the knee, and there was no gapping identified with flexion up to approximately 90 degrees.   We irrigated the wound copiously.   The longitudinal incisions in the quadriceps and patellar tendons were closed with 0 vicryl.  We then closed the incision in a multilayer fashion with absorbable suture.  Sterile dressing was placed.  She was placed into a hinged knee brace, locked in extension.  The patient was awoken taken to PACU in stable condition.   Post-operative plan:  The patient will be NWB on the left leg, wearing the brace at all times. She will be discharged from the PACU once she has recovered.    DVT prophylaxis Aspirin 81 mg twice daily for 6 weeks.    Pain control with PRN pain medication preferring oral medicines.   Follow up plan will be scheduled in approximately 10-14 days for incision check and XR.

## 2021-10-08 NOTE — Discharge Instructions (Addendum)
Loretta Fabrizio A. Dallas Schimke, MD MS [facility] [address] [place],  [place]  [address] Phone: [phone] Fax: [phone]   POST-OPERATIVE INSTRUCTIONS - LOWER EXTREMITY   WOUND CARE Please keep dressing clean, dry and intact until followup. Keep your leg straight at all times until your follow up appointment. You can remove the outer dressing on Post-op Day #3 You may shower on Post-Op Day #3. You must keep the dressing dry during this process and may find that a plastic bag taped around the leg or alternatively a towel based bath may be a better option. If you get your dressing wet, please contact our clinic.  EXERCISES Due to your injury you will not be able to bear weight through your extremity.   DO NOT PUT ANY WEIGHT ON YOUR OPERATIVE LEG Please use crutches or a walker to avoid weight bearing.   REGIONAL ANESTHESIA (NERVE BLOCKS) The anesthesia team may have performed a nerve block for you if safe in the setting of your care.  This is a great tool used to minimize pain.  Typically the block may start wearing off overnight but the long acting medicine may last for 3-4 days.  The nerve block wearing off can be a challenging period but please utilize your as needed pain medications to try and manage this period.    POST-OP MEDICATIONS- Multimodal approach to pain control  In general your pain will be controlled with a combination of substances.  Prescriptions unless otherwise discussed are electronically sent to your pharmacy.  This is a carefully made plan we use to minimize narcotic use.     - Celebrex - Anti-inflammatory medication taken on a scheduled basis  - Acetaminophen - Non-narcotic pain medicine taken on a scheduled basis   - Oxycodone - This is a strong narcotic, to be used only on an "as needed" basis for pain.  -  Aspirin 81mg  - This medicine is used to minimize the risk of blood clots after surgery.             -          Zofran - take as  needed for nausea   FOLLOW-UP If you develop a Fever (>101.5), Redness or Drainage from the surgical incision site, please call our office to arrange for an evaluation. Please call the office to schedule a follow-up appointment for your incision check if you do not already have one, 10-14 days post-operatively.  IF YOU HAVE ANY QUESTIONS, PLEASE FEEL FREE TO CALL OUR OFFICE.  HELPFUL INFORMATION  If you had a block, it will wear off between 8-24 hrs postop typically.  This is period when your pain may go from nearly zero to the pain you would have had postop without the block.  This is an abrupt transition but nothing dangerous is happening.  You may take an extra dose of narcotic when this happens.  You should wean off your narcotic medicines as soon as you are able.  Most patients will be off or using minimal narcotics before their first postop appointment.   Elevating your leg will help with swelling and pain control.  You are encouraged to elevate your leg as much as possible in the first couple of weeks following surgery.  Imagine a drop of water on your toe, and your goal is to get that water back to your heart.  We suggest you use the pain medication the first night prior to going to bed, in order  to ease any pain when the anesthesia wears off. You should avoid taking pain medications on an empty stomach as it will make you nauseous.  Do not drink alcoholic beverages or take illicit drugs when taking pain medications.  In most states it is against the law to drive while you are in a splint or sling.  And certainly against the law to drive while taking narcotics.  You may return to work/school in the next couple of days when you feel up to it.   Pain medication may make you constipated.  Below are a few solutions to try in this order: Decrease the amount of pain medication if you aren't having pain. Drink lots of decaffeinated fluids. Drink prune juice and/or each dried prunes  If  the first 3 don't work start with additional solutions Take Colace - an over-the-counter stool softener Take Senokot - an over-the-counter laxative Take Miralax - a stronger over-the-counter laxative

## 2021-10-08 NOTE — Interval H&P Note (Signed)
History and Physical Interval Note:  10/08/2021 7:19 AM  Loretta Stanley  has presented today for surgery, with the diagnosis of Left patella fracture.  The various methods of treatment have been discussed with the patient and family. After consideration of risks, benefits and other options for treatment, the patient has consented to  Procedure(s): OPEN REDUCTION INTERNAL (ORIF) FIXATION PATELLA (Left) as a surgical intervention.  The patient's history has been reviewed, patient examined, no change in status, stable for surgery.  I have reviewed the patient's chart and labs.  Questions were answered to the patient's satisfaction.     Oliver Barre

## 2021-10-08 NOTE — Anesthesia Procedure Notes (Signed)
Anesthesia Regional Block: Femoral nerve block   Pre-Anesthetic Checklist: , timeout performed,  Correct Patient, Correct Site, Correct Laterality,  Correct Procedure, Correct Position, site marked,  Risks and benefits discussed,  Surgical consent,  Pre-op evaluation,  At surgeon's request and post-op pain management  Laterality: Left and Lower  Prep: chloraprep       Needles:  Injection technique: Single-shot  Needle Type: Echogenic Stimulator Needle     Needle Length: 10cm  Needle Gauge: 20   Needle insertion depth: 6 cm   Additional Needles:   Procedures:, nerve stimulator,,,,,     Nerve Stimulator or Paresthesia:  Response: Twitch elicited, 0.6 mA, 6 cm  Additional Responses:   Narrative:  Start time: 10/08/2021 7:27 AM End time: 10/08/2021 7:37 AM Injection made incrementally with aspirations every 5 mL.  Performed by: Personally  Anesthesiologist: Molli Barrows, MD  Additional Notes: BP cuff, EKG monitors applied. Sedation begun. After nerve location anesthetic injected incrementally, slowly , and after neg aspirations. Tolerated well.

## 2021-10-08 NOTE — Transfer of Care (Signed)
Immediate Anesthesia Transfer of Care Note  Patient: Loretta Stanley  Procedure(s) Performed: OPEN REDUCTION INTERNAL (ORIF) FIXATION PATELLA (Left: Knee)  Patient Location: PACU  Anesthesia Type:General  Level of Consciousness: awake  Airway & Oxygen Therapy: Patient Spontanous Breathing and Patient connected to nasal cannula oxygen  Post-op Assessment: Report given to RN and Post -op Vital signs reviewed and stable  Post vital signs: Reviewed and stable  Last Vitals:  Vitals Value Taken Time  BP 121/77   Temp    Pulse 90 10/08/21 1008  Resp 15 10/08/21 1008  SpO2 100 % 10/08/21 1008  Vitals shown include unvalidated device data.  Last Pain:  Vitals:   10/08/21 0705  TempSrc: Oral  PainSc: 5       Patients Stated Pain Goal: 7 (10/08/21 0705)  Complications: No notable events documented.

## 2021-10-13 ENCOUNTER — Telehealth: Payer: Self-pay

## 2021-10-13 ENCOUNTER — Encounter (HOSPITAL_COMMUNITY): Payer: Self-pay | Admitting: Orthopedic Surgery

## 2021-10-13 MED ORDER — OXYCODONE HCL 5 MG PO TABS: 5.0000 mg | ORAL_TABLET | ORAL | 0 refills | Status: AC | PRN

## 2021-10-21 ENCOUNTER — Ambulatory Visit (INDEPENDENT_AMBULATORY_CARE_PROVIDER_SITE_OTHER): Payer: 59 | Admitting: Orthopedic Surgery

## 2021-10-21 ENCOUNTER — Encounter: Payer: Self-pay | Admitting: Orthopedic Surgery

## 2021-10-21 ENCOUNTER — Ambulatory Visit (INDEPENDENT_AMBULATORY_CARE_PROVIDER_SITE_OTHER): Payer: 59

## 2021-10-21 ENCOUNTER — Other Ambulatory Visit: Payer: Self-pay | Admitting: Orthopedic Surgery

## 2021-10-21 VITALS — Ht 70.0 in | Wt 115.0 lb

## 2021-10-21 DIAGNOSIS — Z9889 Other specified postprocedural states: Secondary | ICD-10-CM

## 2021-10-21 DIAGNOSIS — S82032D Displaced transverse fracture of left patella, subsequent encounter for closed fracture with routine healing: Secondary | ICD-10-CM

## 2021-10-21 DIAGNOSIS — Z8781 Personal history of (healed) traumatic fracture: Secondary | ICD-10-CM

## 2021-10-22 ENCOUNTER — Encounter: Payer: Self-pay | Admitting: Orthopedic Surgery

## 2021-10-22 NOTE — Addendum Note (Signed)
Addended by: Baird Kay on: 10/22/2021 03:23 PM   Modules accepted: Orders

## 2021-10-22 NOTE — Progress Notes (Signed)
Postop visit  Assessment: Loretta Stanley is a 76 y.o. female with the following: 1. Closed displaced transverse fracture of left patella; ORIF 10/08/2021 2.  Right great toe, distal phalanx fracture  Plan: Loretta Stanley underwent ORIF of left transverse patella fracture Sutures were trimmed, and Steri-Strips were placed. Procedure was discussed Rehabilitation was discussed Plan to place referral for home health physical therapy Okay to work on active flexion as tolerated, but only passive extension at this time to full extension. Can start to initiate weightbearing, with the brace locked.  25% weightbearing at this time. No additional medications needed Continued to wear the postop shoe on the right foot.  Limited weightbearing.  We will repeat x-rays at the next visit. Follow-up in 4 weeks.   Follow-up: Return in about 4 weeks (around 11/18/2021).  Subjective:  Chief Complaint  Patient presents with   Routine Post Op    Lt patella ORIF DOS 10/08/21    History of Present Illness: Loretta Stanley is a 76 y.o. female who presents to clinic for her first postoperative visit.  Surgery was approximately 2 weeks ago.  She is doing well.  Her pain is improving.  She has tolerated the brace.  She has limited her weightbearing as instructed.  She has not been working on any exercises at this time.  She was using the ice machine, but this is no longer working effectively.  She denies numbness and tingling.  No fevers or chills.  She has noticed that she is requiring less pain medication.   Review of Systems: No fevers or chills No numbness or tingling No chest pain No shortness of breath No bowel or bladder dysfunction No GI distress No headaches    Objective: Ht 5\' 10"  (1.778 m)   Wt 115 lb (52.2 kg)   BMI 16.50 kg/m   Physical Exam:  General: Elderly female. and Alert and oriented.  Seated in a wheelchair Gait: Unable to ambulate.  Left knee surgical incision is  healing well.  No surrounding erythema or drainage.  There is healing bruising distal to the incision site.  She is able to achieve full extension passively.  She is able to flex her knee to approximately 45 degrees without discomfort.  Toes are warm and well-perfused.  Right foot with some swelling.  Mild tenderness to palpation over the great toe.  Toes are warm and well-perfused.  IMAGING: I personally reviewed images previously obtained from the ED  AP and lateral x-rays of the left knee were obtained in clinic today.  Hardware remains in unchanged position within the patella.  Patella fracture is in good alignment.  There is no step-off visible on the joint line.  No change in overall position.  No acute injuries.  No hardware failure.  Impression: Left transverse patella fracture in stable alignment following operative fixation.     New Medications:  No orders of the defined types were placed in this encounter.     , MD  10/22/2021 7:34 AM

## 2021-11-16 ENCOUNTER — Telehealth: Payer: Self-pay | Admitting: Orthopedic Surgery

## 2021-11-16 NOTE — Telephone Encounter (Signed)
Patient called, also had on line, [facility] care navigator [phone], Jodie Echevaria, to follow up on status of order for walker. States has had to borrow one. States she has not heard about the walker - states rehaprovider who is coming to her home also asked.  Patient and care navigator aware of patient's appointment tomorrow, 11/17/21. Aware we are sending a message this evening for Dr and for nurse, for tomorrow.

## 2021-11-17 ENCOUNTER — Encounter: Payer: Self-pay | Admitting: Orthopedic Surgery

## 2021-11-17 ENCOUNTER — Ambulatory Visit (INDEPENDENT_AMBULATORY_CARE_PROVIDER_SITE_OTHER): Payer: 59 | Admitting: Orthopedic Surgery

## 2021-11-17 ENCOUNTER — Ambulatory Visit (INDEPENDENT_AMBULATORY_CARE_PROVIDER_SITE_OTHER): Payer: 59

## 2021-11-17 ENCOUNTER — Other Ambulatory Visit: Payer: Self-pay | Admitting: Orthopedic Surgery

## 2021-11-17 DIAGNOSIS — S82032D Displaced transverse fracture of left patella, subsequent encounter for closed fracture with routine healing: Secondary | ICD-10-CM

## 2021-11-17 DIAGNOSIS — S92421D Displaced fracture of distal phalanx of right great toe, subsequent encounter for fracture with routine healing: Secondary | ICD-10-CM

## 2021-11-17 DIAGNOSIS — Z9889 Other specified postprocedural states: Secondary | ICD-10-CM | POA: Diagnosis not present

## 2021-11-17 DIAGNOSIS — Z8781 Personal history of (healed) traumatic fracture: Secondary | ICD-10-CM | POA: Diagnosis not present

## 2021-11-17 DIAGNOSIS — S92421A Displaced fracture of distal phalanx of right great toe, initial encounter for closed fracture: Secondary | ICD-10-CM

## 2021-11-17 NOTE — Telephone Encounter (Signed)
Will discuss during appointment

## 2021-11-17 NOTE — Patient Instructions (Signed)
Can put full weight on both the right foot and the left leg  If your leg can handle the weight, you do not need to wear the brace  Continue to work on range of motion of the left knee, like we reviewed in clinic today.   Follow up in 6 weeks

## 2021-11-18 NOTE — Progress Notes (Signed)
Postop visit  Assessment: Loretta Stanley is a 76 y.o. female with the following: 1. Closed displaced transverse fracture of left patella; ORIF 10/08/2021 2.  Right great toe, distal phalanx fracture  Plan: Loretta Stanley underwent ORIF of left transverse patella fracture.  She is doing well.  Her pain is improving.  There has been considerable confusion regarding her therapy recommendations.  In an attempt to simplify her recovery, she was provided with a protocol that I want her to follow-up.  This was highlighted.  She can advance to weightbearing as tolerated using walker.  If she has good control of the quadriceps, she does not need to use the brace.  I want her to continue to work on range of motion, achieving full extension, and improving her flexion well beyond 90 degrees.  Exercises were demonstrated in clinic today.  Regarding the skin breakdown on her great toe of the right foot, I recommended local wound care.  This will continue to improve.  I would like to see her back in approximately 6 weeks.    Follow-up: Return in about 6 weeks (around 12/29/2021).  Subjective:  Chief Complaint  Patient presents with   Routine Post Op    DOS 10/08/21 S/p ORIF Patella LEFT    History of Present Illness: Loretta Stanley is a 76 y.o. female who returns to clinic for a postoperative visit.  Surgery was approximately 6 weeks ago.  She is doing well.  She is not having much pain.  She notes swelling in the left foot.  In addition, she has noticed some skin breakdown on the right toe, associated with the buddy taping.  She has been working with home health physical therapy, but she has not advanced beyond 25%, partial weightbearing.  She has been using a walker.  She has been working on her range of motion.  No issues with her surgical incision.     Review of Systems: No fevers or chills No numbness or tingling No chest pain No shortness of breath No bowel or bladder dysfunction No GI  distress No headaches    Objective: There were no vitals taken for this visit.  Physical Exam:  General: Elderly female. and Alert and oriented.  Seated in a wheelchair Gait: Ambulates with the assistance of a walker.  Left knee surgical incision is healing well.  No surrounding erythema or drainage.  No tenderness palpation in line with the incision.  She is able to get to just short of extension.  Over time, she is able to get to full extension with passive stretching.  Flexion to approximately 95 degrees, but this gets better with sustained stretching.  There are indentations on her leg, consistent with the brace being too tight.  In addition, she has diffuse swelling over the left foot.  Toes are warm and well-perfused.  Sensation is intact over the dorsum of her foot.  Right foot with some swelling to the great toe, and the second toe.  She has a blister on the lateral aspect of the second toe.  There are some skin breakdown in the first webspace.  Minimal tenderness to palpation  IMAGING: I personally reviewed images previously obtained from the ED   AP and lateral x-rays of the left knee were obtained in clinic today.  The transverse patella fracture remains in good alignment.  No evidence of hardware failure.  There has been no migration of the screws.  Interval consolidation of the fracture site.  Impression: Healing  left patella fracture in stable position   X-rays of the right foot were obtained in clinic today.  There is a minimally displaced fracture of the distal phalanx of the great toe.  This remains in good alignment.  There has been no interval displacement.  No additional injuries are noted.  Impression: Healing right great toe fracture    New Medications:  No orders of the defined types were placed in this encounter.     Oliver Barre, MD  11/18/2021 8:25 AM

## 2021-11-20 ENCOUNTER — Telehealth: Payer: Self-pay | Admitting: Radiology

## 2021-11-20 NOTE — Telephone Encounter (Signed)
Patients family and patient have declined further physical therapy at this point, state she has met goals. Physical therapy is d/c at this time.

## 2021-12-29 ENCOUNTER — Ambulatory Visit (INDEPENDENT_AMBULATORY_CARE_PROVIDER_SITE_OTHER): Payer: 59

## 2021-12-29 ENCOUNTER — Encounter: Payer: Self-pay | Admitting: Orthopedic Surgery

## 2021-12-29 ENCOUNTER — Ambulatory Visit (INDEPENDENT_AMBULATORY_CARE_PROVIDER_SITE_OTHER): Payer: 59 | Admitting: Orthopedic Surgery

## 2021-12-29 DIAGNOSIS — S82032D Displaced transverse fracture of left patella, subsequent encounter for closed fracture with routine healing: Secondary | ICD-10-CM

## 2021-12-29 DIAGNOSIS — S92421D Displaced fracture of distal phalanx of right great toe, subsequent encounter for fracture with routine healing: Secondary | ICD-10-CM

## 2021-12-29 NOTE — Progress Notes (Unsigned)
Postop visit  Assessment: Loretta Stanley is a 76 y.o. female with the following: 1. Closed displaced transverse fracture of left patella; ORIF 10/08/2021 2.  Right great toe, distal phalanx fracture  Plan: Loretta Stanley underwent ORIF of left transverse patella fracture.  She is doing well.  Her pain is improving.  There has been considerable confusion regarding her therapy recommendations.  In an attempt to simplify her recovery, she was provided with a protocol that I want her to follow-up.  This was highlighted.  She can advance to weightbearing as tolerated using walker.  If she has good control of the quadriceps, she does not need to use the brace.  I want her to continue to work on range of motion, achieving full extension, and improving her flexion well beyond 90 degrees.  Exercises were demonstrated in clinic today.  Regarding the skin breakdown on her great toe of the right foot, I recommended local wound care.  This will continue to improve.  I would like to see her back in approximately 6 weeks.    Follow-up: No follow-ups on file.  Subjective:  Chief Complaint  Patient presents with   Knee Injury    Left patella 10/08/21   Foot Injury    Great toe right     History of Present Illness: Loretta Stanley is a 76 y.o. female who returns to clinic for a postoperative visit.  Surgery was approximately 6 weeks ago.  She is doing well.  She is not having much pain.  She notes swelling in the left foot.  In addition, she has noticed some skin breakdown on the right toe, associated with the buddy taping.  She has been working with home health physical therapy, but she has not advanced beyond 25%, partial weightbearing.  She has been using a walker.  She has been working on her range of motion.  No issues with her surgical incision.     Review of Systems: No fevers or chills No numbness or tingling No chest pain No shortness of breath No bowel or bladder dysfunction No GI  distress No headaches    Objective: There were no vitals taken for this visit.  Physical Exam:  General: Elderly female. and Alert and oriented.  Seated in a wheelchair Gait: Ambulates with the assistance of a walker.  Left knee surgical incision is healing well.  No surrounding erythema or drainage.  No tenderness palpation in line with the incision.  She is able to get to just short of extension.  Over time, she is able to get to full extension with passive stretching.  Flexion to approximately 95 degrees, but this gets better with sustained stretching.  There are indentations on her leg, consistent with the brace being too tight.  In addition, she has diffuse swelling over the left foot.  Toes are warm and well-perfused.  Sensation is intact over the dorsum of her foot.  Right foot with some swelling to the great toe, and the second toe.  She has a blister on the lateral aspect of the second toe.  There are some skin breakdown in the first webspace.  Minimal tenderness to palpation  IMAGING: I personally reviewed images previously obtained from the ED   AP and lateral x-rays of the left knee were obtained in clinic today.  The transverse patella fracture remains in good alignment.  No evidence of hardware failure.  There has been no migration of the screws.  Interval consolidation of the fracture  site.  Impression: Healing left patella fracture in stable position   X-rays of the right foot were obtained in clinic today.  There is a minimally displaced fracture of the distal phalanx of the great toe.  This remains in good alignment.  There has been no interval displacement.  No additional injuries are noted.  Impression: Healing right great toe fracture    New Medications:  No orders of the defined types were placed in this encounter.     Oliver Barre, MD  12/29/2021 11:37 AM

## 2021-12-29 NOTE — Patient Instructions (Signed)
Keep walking to improve strength  Watch the sore on your toes to ensure it does not get worse.

## 2021-12-30 ENCOUNTER — Encounter: Payer: Self-pay | Admitting: Orthopedic Surgery

## 2022-03-30 ENCOUNTER — Encounter: Payer: Self-pay | Admitting: Orthopedic Surgery

## 2022-03-30 ENCOUNTER — Ambulatory Visit (INDEPENDENT_AMBULATORY_CARE_PROVIDER_SITE_OTHER): Payer: 59 | Admitting: Orthopedic Surgery

## 2022-03-30 ENCOUNTER — Ambulatory Visit (INDEPENDENT_AMBULATORY_CARE_PROVIDER_SITE_OTHER): Payer: 59

## 2022-03-30 VITALS — BP 126/80 | HR 50 | Ht 70.0 in | Wt 115.0 lb

## 2022-03-30 DIAGNOSIS — S82032D Displaced transverse fracture of left patella, subsequent encounter for closed fracture with routine healing: Secondary | ICD-10-CM

## 2022-03-30 NOTE — Patient Instructions (Addendum)
You can get some shoe inserts for the left foot.  Look for something with an arch support   Continue to work to get your leg straight.  Recommend the exercises that we demonstrated in clinic today   Instructions  1.  You have sustained an ankle sprain, or similar exercises that can be treated as an ankle sprain.  **These exercises can also be used as part of recovery from an ankle fracture.  2.  I encourage you to stay on your feet and gradually remove your walking boot.   3.  Below are some exercises that you can complete on your own to improve your symptoms.  4.  As an alternative, you can search for ankle sprain exercises online, and can see some demonstrations on YouTube  5.  If you are having difficulty with these exercises, we can also prescribe formal physical therapy  Ankle Exercises Ask your health care provider which exercises are safe for you. Do exercises exactly as told by your health care provider and adjust them as directed. It is normal to feel mild stretching, pulling, tightness, or mild discomfort as you do these exercises. Stop right away if you feel sudden pain or your pain gets worse. Do not begin these exercises until told by your health care provider.  Stretching and range-of-motion exercises These exercises warm up your muscles and joints and improve the movement and flexibility of your ankle. These exercises may also help to relieve pain.  Dorsiflexion/plantar flexion  Sit with your L knee straight or bent. Do not rest your foot on anything. Flex your left ankle to tilt the top of your foot toward your shin. This is called dorsiflexion. Hold this position for 5 seconds. Point your toes downward to tilt the top of your foot away from your shin. This is called plantar flexion. Hold this position for 5 seconds. Repeat 10 times. Complete this exercise 2-3 times a day.  As tolerated  Ankle alphabet  Sit with your L foot supported at your lower leg. Do not rest your  foot on anything. Make sure your foot has room to move freely. Think of your L foot as a paintbrush: Move your foot to trace each letter of the alphabet in the air. Keep your hip and knee still while you trace the letters. Trace every letter from A to Z. Make the letters as large as you can without causing or increasing any discomfort.  Repeat 2-3 times. Complete this exercise 2-3 times a day.   Strengthening exercises These exercises build strength and endurance in your ankle. Endurance is the ability to use your muscles for a long time, even after they get tired. Dorsiflexors These are muscles that lift your foot up. Secure a rubber exercise band or tube to an object, such as a table leg, that will stay still when the band is pulled. Secure the other end around your L foot. Sit on the floor, facing the object with your L leg extended. The band or tube should be slightly tense when your foot is relaxed. Slowly flex your L ankle and toes to bring your foot toward your shin. Hold this position for 5 seconds. Slowly return your foot to the starting position, controlling the band as you do that. Repeat 10 times. Complete this exercise 2-3 times a day.  Plantar flexors These are muscles that push your foot down. Sit on the floor with your L leg extended. Loop a rubber exercise band or tube around the ball  of your L foot. The ball of your foot is on the walking surface, right under your toes. The band or tube should be slightly tense when your foot is relaxed. Slowly point your toes downward, pushing them away from you. Hold this position for 5 seconds. Slowly release the tension in the band or tube, controlling smoothly until your foot is back in the starting position. Repeat 10 times. Complete this exercise 2-3 times a day.  Towel curls  Sit in a chair on a non-carpeted surface, and put your feet on the floor. Place a towel in front of your feet. Keeping your heel on the floor, put your  L foot on the towel. Pull the towel toward you by grabbing the towel with your toes and curling them under. Keep your heel on the floor. Let your toes relax. Grab the towel again. Keep pulling the towel until it is completely underneath your foot. Repeat 10 times. Complete this exercise 2-3 times a day.  Standing plantar flexion This is an exercise in which you use your toes to lift your body's weight while standing. Stand with your feet shoulder-width apart. Keep your weight spread evenly over the width of your feet while you rise up on your toes. Use a wall or table to steady yourself if needed, but try not to use it for support. If this exercise is too easy, try these options: Shift your weight toward your L leg until you feel challenged. If told by your health care provider, lift your uninjured leg off the floor. Hold this position for 5 seconds. Repeat 10 times. Complete this exercise 2-3 times a day.  Tandem walking Stand with one foot directly in front of the other. Slowly raise your back foot up, lifting your heel before your toes, and place it directly in front of your other foot. Continue to walk in this heel-to-toe way. Have a countertop or wall nearby to use if needed to keep your balance, but try not to hold onto anything for support.  Repeat 10 times. Complete this exercise 2-3 times a day.

## 2022-03-30 NOTE — Progress Notes (Signed)
Return visit  Assessment: Loretta Stanley is a 76 y.o. female with the following: 1. Closed displaced transverse fracture of left patella; ORIF 10/08/2021   Plan: SAIDI SANTACROCE is doing well following surgery.  Radiographs are stable, with consolidation of the fracture site.  No migration of the hardware.  She is just short of full extension, which is likely contributing to her mild antalgic gait.  Once again, reviewed exercises with her in clinic, in order to achieve full extension.  She states her understanding.  Regarding the issue she is having in her left ankle and foot, these are most likely related to a developing flatfoot deformity.  Encouraged her to wear supportive footwear.  She can consider over-the-counter orthotics.  Instructions were provided for the patient.  She will continue to see improvement following her surgery.  If she has any further issues, she will contact the clinic.  Otherwise, follow-up as needed.    Follow-up: Return if symptoms worsen or fail to improve.  Subjective:  Chief Complaint  Patient presents with   Follow-up    Recheck on left knee, DOS 10/08/21.    History of Present Illness: Loretta Stanley is a 76 y.o. female who returns to clinic for a postoperative visit.  Surgery was approximately 5 months ago.  Overall, she has had no issues in her recovery.  She has not worked with physical therapy, but she has done exercises on her own.  She continues with her usual activities around the house.  She notes some mild discomfort in the knee.  No issues with her surgical incision.  She continues to have some pain around the left ankle, and into the midfoot.  She has been wearing a brace, which has been helping.  She has had issues with her foot in the past.   Review of Systems: No fevers or chills No numbness or tingling No chest pain No shortness of breath No bowel or bladder dysfunction No GI distress No headaches    Objective: BP 126/80    Pulse (!) 50   Ht 5\' 10"  (1.778 m)   Wt 115 lb (52.2 kg)   BMI 16.50 kg/m   Physical Exam:  General: Elderly female. and Alert and oriented.  Seated in a wheelchair Gait: Ambulates without assistance.  Anterior left knee surgical incision is healed.  No surrounding erythema or drainage.  Range of motion from 5-120 degrees.  Persistent atrophy of the quadricep.  No increased laxity of the varus or valgus stress.  No pain with patellar grind.  She has developed mild flatfoot deformity, and the breakdown of her shoes are consistent with this.  She has no swelling about her ankle.  No signs of trauma.  No bruising.  No swelling.   IMAGING: I personally reviewed images previously obtained from the ED  AP and lateral x-rays of the left knee were obtained in clinic today.  No acute injuries are noted.  There has been interval consolidation of the transverse patella fracture.  Hardware remains intact.  No evidence of screw migration.  Screws have backed out.  No bony lesions.  Impression: Healed left transverse patella fracture without hardware failure.  New Medications:  No orders of the defined types were placed in this encounter.     , MD  03/30/2022 10:29 PM

## 2022-06-10 ENCOUNTER — Other Ambulatory Visit (HOSPITAL_COMMUNITY): Payer: Self-pay | Admitting: Family Medicine

## 2022-06-10 DIAGNOSIS — Z780 Asymptomatic menopausal state: Secondary | ICD-10-CM

## 2022-06-15 ENCOUNTER — Ambulatory Visit (HOSPITAL_COMMUNITY)
Admission: RE | Admit: 2022-06-15 | Discharge: 2022-06-15 | Disposition: A | Discharge status: Home / Self Care | Payer: 59 | Source: Ambulatory Visit | Attending: Family Medicine | Admitting: Family Medicine

## 2022-06-15 DIAGNOSIS — Z780 Asymptomatic menopausal state: Secondary | ICD-10-CM | POA: Diagnosis present

## 2024-03-11 IMAGING — DX DG KNEE 1-2V PORT*L*
2 series · 2 of 2 positions shown · non-contrast
Comparison: Left knee 10/04/2021

CLINICAL DATA: ORIF patellar fracture

EXAM:
PORTABLE LEFT KNEE - 1-2 VIEW

[knee ap]
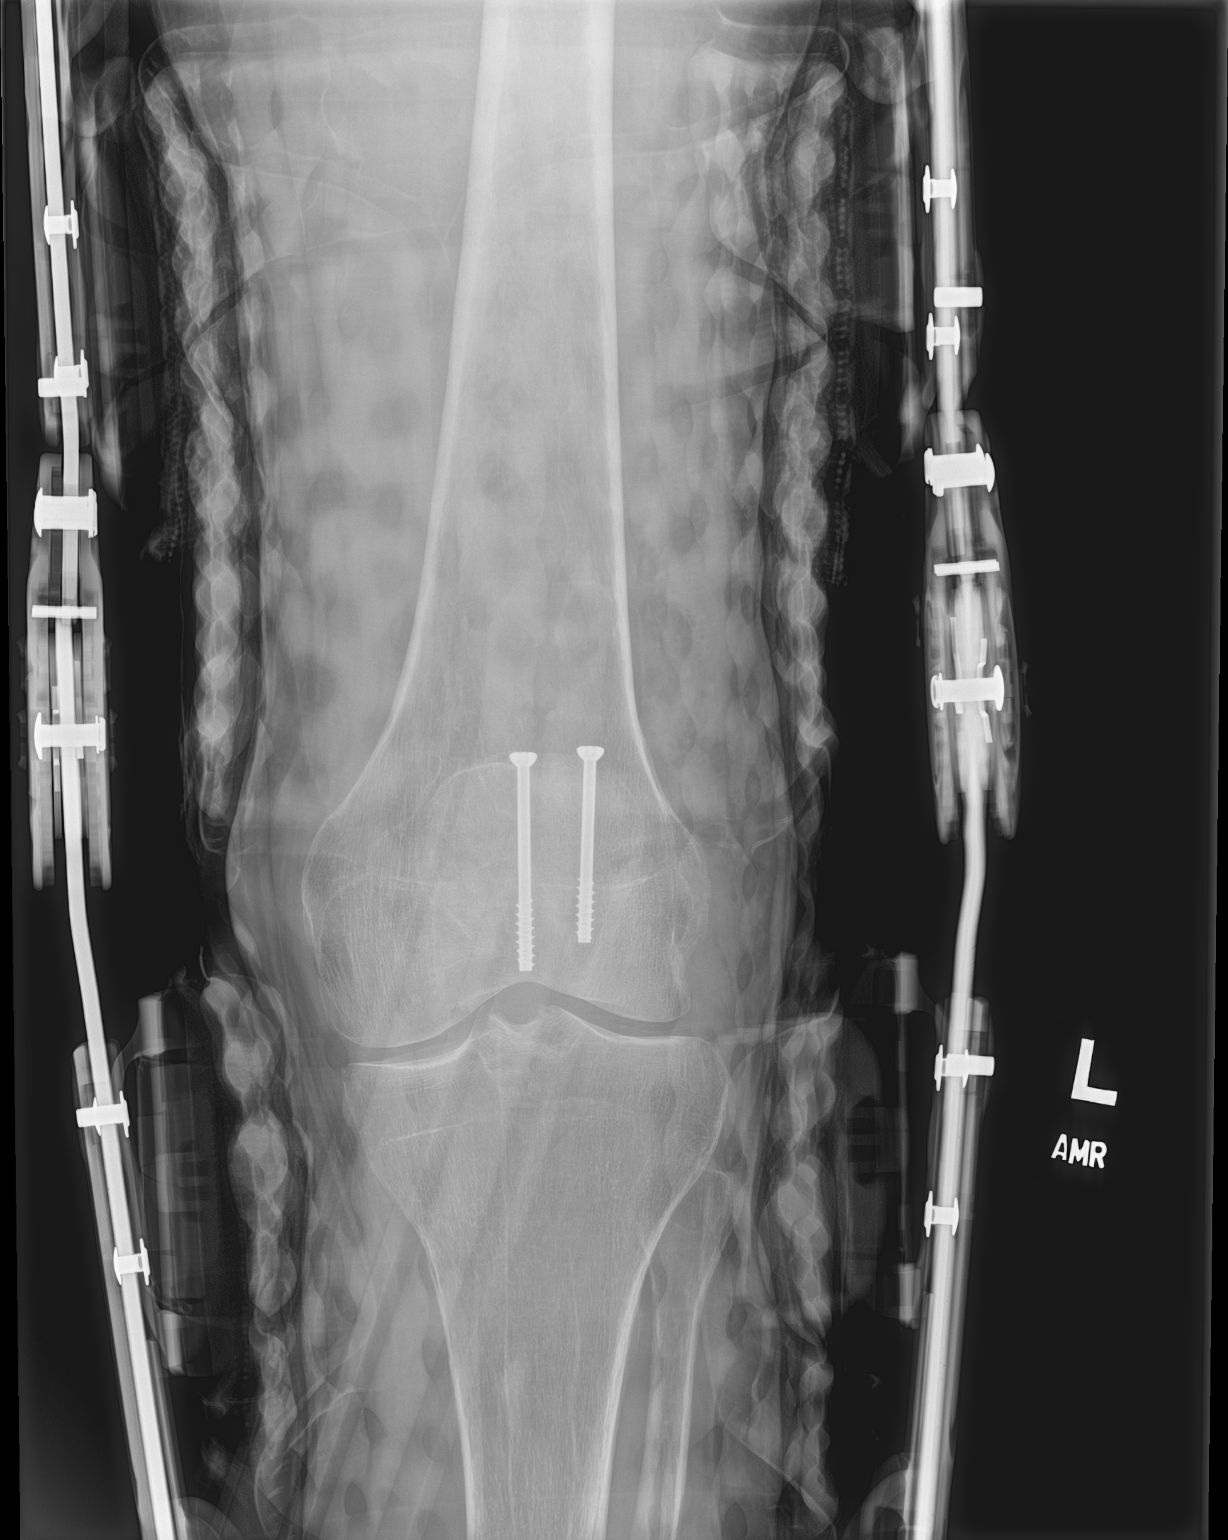

[knee lat]
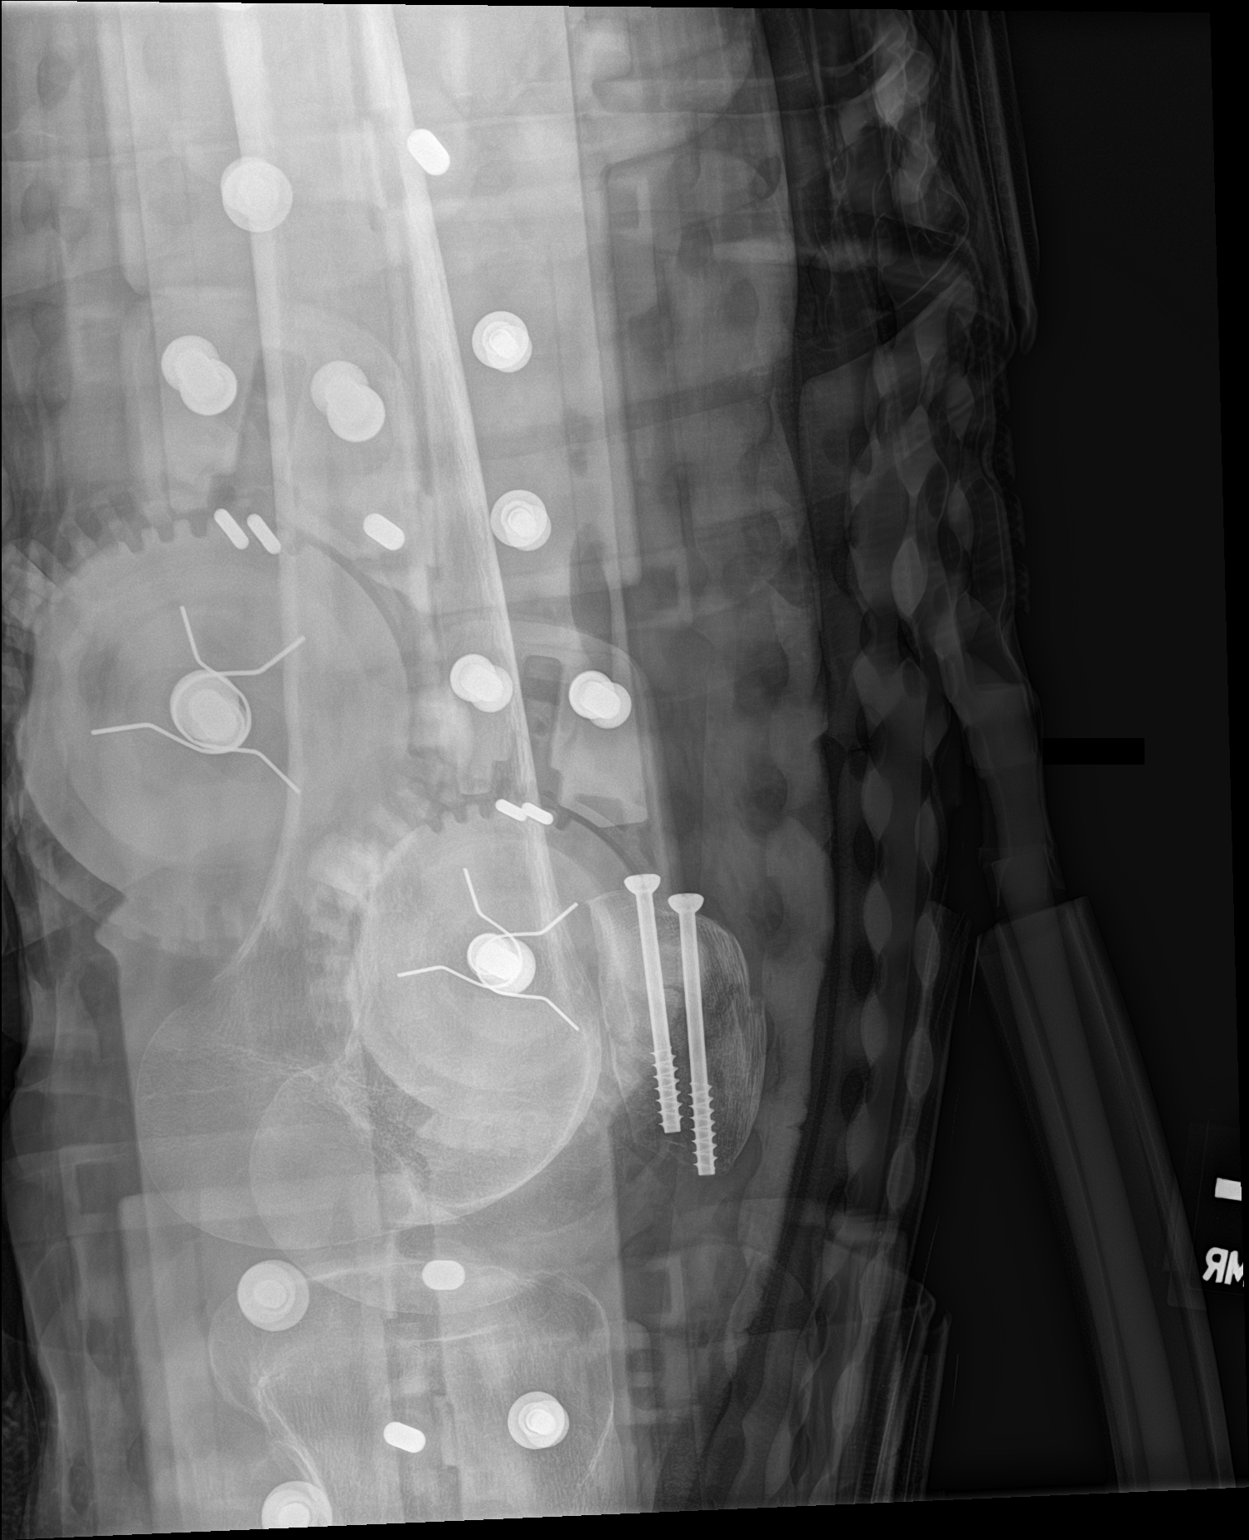

[2 of 2 positions shown; findings below may reference images not displayed]

FINDINGS: Postop ORIF patellar fracture. Two screws are present across the
fracture. Fracture in satisfactory alignment. Bony detail limited by
overlying splint.
IMPRESSION: Satisfactory ORIF patellar fracture with 2 screws.

## 2024-04-23 ENCOUNTER — Other Ambulatory Visit: Payer: Self-pay

## 2024-04-23 ENCOUNTER — Ambulatory Visit (INDEPENDENT_AMBULATORY_CARE_PROVIDER_SITE_OTHER): Admitting: Internal Medicine

## 2024-04-23 ENCOUNTER — Encounter: Payer: Self-pay | Admitting: Internal Medicine

## 2024-04-23 VITALS — BP 142/100 | HR 91 | Temp 97.8°F | Resp 18 | Ht 68.0 in | Wt 117.8 lb

## 2024-04-23 DIAGNOSIS — L2089 Other atopic dermatitis: Secondary | ICD-10-CM

## 2024-04-23 MED ORDER — TACROLIMUS 0.1 % EX OINT: TOPICAL_OINTMENT | Freq: Two times a day (BID) | CUTANEOUS | 5 refills | Status: AC

## 2024-04-23 MED ORDER — TRIAMCINOLONE ACETONIDE 0.1 % EX OINT: TOPICAL_OINTMENT | CUTANEOUS | 5 refills | Status: AC

## 2024-04-23 NOTE — Progress Notes (Addendum)
 "  NEW PATIENT  Date of Service/Encounter:  04/23/2024  Consult requested by: Vick Lurie, FNP (Inactive)   Subjective:   Loretta Stanley (DOB: 09/16/1945) is a 79 y.o. female who presents to the clinic on 04/23/2024 with a chief complaint of Establish Care (Pt had a flare up of dermatitis recently. Adopted a cat last June from a late friend. Now daughter lives there and brought a dog with her, so the cat is having to stay in the room more often. ) .    History obtained from: chart review and patient.  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 However, more recently, around March, has noted worsening flare ups on her hands, back.  It is itchy, dry, red skin.  She uses Cerave and topical steroids and recently was put on Protopic  to use PRN.  This has helped but has not resolved it. Notes adopting a cat and taking care of daughter's dog.  No hx of allergy testing.  Has trouble with her memory.   Reviewed:  07/12/2023: seen by for eczema by PCP, referred to Dermatology.    Past Medical History: Past Medical History:  Diagnosis Date   Arthritis    Depression    Eczema    Hypertension    IBS (irritable bowel syndrome)    Past Surgical History: Past Surgical History:  Procedure Laterality Date   CATARACT EXTRACTION W/PHACO  10/25/2011   Procedure: CATARACT EXTRACTION PHACO AND INTRAOCULAR LENS PLACEMENT (IOC);  Surgeon: Cherene Mania, MD;  Location: [facility];  Service: Ophthalmology;  Laterality: Right;  CDE:14.35   CATARACT EXTRACTION W/PHACO  11/08/2011   Procedure: CATARACT EXTRACTION PHACO AND INTRAOCULAR LENS PLACEMENT (IOC);  Surgeon: Cherene Mania, MD;  Location: [facility];  Service: Ophthalmology;  Laterality: Left;  CDE 16.84   COLONOSCOPY N/A 08/15/2014   Procedure: COLONOSCOPY;  Surgeon: Claudis RAYMOND Rivet, MD;  Location: [facility];  Service: Endoscopy;  Laterality: N/A;  930   DILATION AND CURETTAGE OF UTERUS     LEFT  HEART CATH AND CORONARY ANGIOGRAPHY N/A 08/25/2016   Procedure: Left Heart Cath and Coronary Angiography;  Surgeon: Verlin Lonni BIRCH, MD;  [facility] INVASIVE CV LAB;  Service: Cardiovascular;  Laterality: N/A;   ORIF PATELLA Left 10/08/2021   Procedure: OPEN REDUCTION INTERNAL (ORIF) FIXATION PATELLA;  Surgeon: Onesimo Oneil LABOR, MD;  Location: [facility];  Service: Orthopedics;  Laterality: Left;    Family History: Family History  Problem Relation Age of Onset   Eczema Father    Heart attack Brother    Medication List:  Allergies as of 04/23/2024       Reactions   Latex Itching        Medication List        Accurate as of April 23, 2024 12:10 PM. If you have any questions, ask your nurse or doctor.          CALCIUM-D PO Take 1 tablet by mouth 2 (two) times daily.   GAS-X PO Take by mouth.   hydrocortisone cream 1 % Apply 1 application topically daily as needed for itching.   lisinopril  20 MG tablet Commonly known as: ZESTRIL  Take 20 mg by mouth daily.   loratadine 10 MG tablet Commonly known as: CLARITIN Take 10 mg by mouth daily.   OVER THE COUNTER MEDICATION B12 one tablet weekly   tacrolimus  0.03 % ointment Commonly known as: PROTOPIC  Apply topically 2 (two) times  daily.         REVIEW OF SYSTEMS: Pertinent positives and negatives discussed in HPI.   Objective:   Physical Exam: BP (!) 142/100 (BP Location: Left Arm, Patient Position: Sitting, Cuff Size: Normal)   Pulse 91   Temp 97.8 F (36.6 C) (Temporal)   Resp 18   Ht 5' 8 (1.727 m)   Wt 117 lb 12.8 oz (53.4 kg)   SpO2 97%   BMI 17.91 kg/m  Body mass index is 17.91 kg/m. GEN: alert, well developed HEENT: clear conjunctiva, nose without rhinorrhea  HEART: regular rate and rhythm, no murmur LUNGS: clear to auscultation bilaterally, no coughing, unlabored respiration ABDOMEN: soft, non distended  SKIN: dry erythematous skin on bl hands   Assessment:   1. Other atopic dermatitis      Plan/Recommendations:   Eczema Hand Dermatitis - Due to worsening flares of her eczema, will obtain allergy testing.  Can consider patch testing for chemical triggers if it remains uncontrolled.   - Do a daily soaking tub bath in warm water  for 10-15 minutes.  - Use a gentle, unscented cleanser at the end of the bath (such as Dove unscented bar or baby wash, or Aveeno sensitive body wash). Then rinse, pat half-way dry, and apply a gentle, unscented moisturizer cream or ointment (Cerave cream + Vaseline)  all over while still damp. Dry skin makes the itching and rash of eczema worse. The skin should be moisturized with a gentle, unscented moisturizer at least twice daily.  - Use only unscented liquid laundry detergent. - Apply prescribed topical steroid (triamcinolone  0.1% below neck) to flared areas after the moisturizer has soaked into the skin (wait at least 30 minutes). Taper off the topical steroids as the skin improves. Do not use topical steroid for more than 14 days at a time.  - Put Protopic  0.1% onto areas of rough eczema twice a day. May decrease to once a day as the eczema improves. This will not thin the skin, and is safe for chronic use. Do not put this onto normal appearing skin.  Later received message from referral provider there was concern for contact dermatitis.  Will schedule for NAC80 patch testing.    Return in about 3 months (around 07/22/2024).  Arleta Blanch, MD [facility]        "

## 2024-04-23 NOTE — Patient Instructions (Addendum)
 Eczema Hand Dermatitis - Do a daily soaking tub bath in warm water  for 10-15 minutes.  - Use a gentle, unscented cleanser at the end of the bath (such as Dove unscented bar or baby wash, or Aveeno sensitive body wash). Then rinse, pat half-way dry, and apply a gentle, unscented moisturizer cream or ointment (Cerave cream + Vaseline)  all over while still damp. Dry skin makes the itching and rash of eczema worse. The skin should be moisturized with a gentle, unscented moisturizer at least twice daily.  - Use only unscented liquid laundry detergent. - Apply prescribed topical steroid (triamcinolone  0.1% below neck) to flared areas after the moisturizer has soaked into the skin (wait at least 30 minutes). Taper off the topical steroids as the skin improves. Do not use topical steroid for more than 14 days at a time.  - Put Protopic  0.1% onto areas of rough eczema twice a day. May decrease to once a day as the eczema improves. This will not thin the skin, and is safe for chronic use. Do not put this onto normal appearing skin.

## 2024-04-25 ENCOUNTER — Telehealth: Payer: Self-pay

## 2024-04-25 NOTE — Telephone Encounter (Signed)
 We received a fax from Kaitlin Phelps, PA-C.  Thank you for sending over your progress note from the visit on 04/23/2024. Can you please schedule the patient for allergy patch testing? I didn't see that you were planning on doing the allergy test in your plan and that is the reason I referred the patient to allergy and asthma.  Please advise on how you would for us  to move forward.

## 2024-04-25 NOTE — Telephone Encounter (Signed)
 Loretta Stanley had spoke with the patient. Patient would like to hold off on scheduling NAC80 til after labs are back.

## 2024-04-26 LAB — ALLERGEN PROFILE WITH TOTAL IGE, RESPIRATORY-AREA 2
Alternaria Alternata IgE: <0.1 kU/L
Aspergillus Fumigatus IgE: <0.1 kU/L
Bermuda Grass IgE: 0.1 kU/L — AB
Cat Dander IgE: 27.3 kU/L — AB
Cedar, Mountain IgE: <0.1 kU/L
Cladosporium Herbarum IgE: <0.1 kU/L
Cockroach, German IgE: <0.1 kU/L
Common Silver Birch IgE: <0.1 kU/L
Cottonwood IgE: <0.1 kU/L
D Farinae IgE: 2.38 kU/L — AB
D Pteronyssinus IgE: 1.79 kU/L — AB
Dog Dander IgE: 5.37 kU/L — AB
Elm, American IgE: <0.1 kU/L
IgE (Immunoglobulin E), Serum: 749 [IU]/mL — ABNORMAL HIGH (ref 6–495)
Johnson Grass IgE: <0.1 kU/L
Maple/Box Elder IgE: <0.1 kU/L
Mouse Urine IgE: <0.1 kU/L
Oak, White IgE: <0.1 kU/L
Pecan, Hickory IgE: <0.1 kU/L
Penicillium Chrysogen IgE: <0.1 kU/L
Pigweed, Rough IgE: <0.1 kU/L
Ragweed, Short IgE: <0.1 kU/L
Sheep Sorrel IgE Qn: <0.1 kU/L
Timothy Grass IgE: <0.1 kU/L
White Mulberry IgE: <0.1 kU/L

## 2024-04-30 ENCOUNTER — Ambulatory Visit: Payer: Self-pay | Admitting: Internal Medicine
